# Patient Record
Sex: Female | Born: 1970 | Race: White | Hispanic: No | Marital: Married | Smoking: Never smoker
Health system: Southern US, Community
[De-identification: ages and names within clinical notes are randomized; demographics above are authoritative.]

## PROBLEM LIST (undated history)

## (undated) DIAGNOSIS — G4733 Obstructive sleep apnea (adult) (pediatric): Secondary | ICD-10-CM

## (undated) DIAGNOSIS — E785 Hyperlipidemia, unspecified: Secondary | ICD-10-CM

## (undated) DIAGNOSIS — J309 Allergic rhinitis, unspecified: Secondary | ICD-10-CM

## (undated) DIAGNOSIS — I10 Essential (primary) hypertension: Secondary | ICD-10-CM

## (undated) DIAGNOSIS — G4730 Sleep apnea, unspecified: Secondary | ICD-10-CM

## (undated) HISTORY — DX: Essential (primary) hypertension: I10

## (undated) HISTORY — DX: Hyperlipidemia, unspecified: E78.5

## (undated) HISTORY — PX: OTHER SURGICAL HISTORY: SHX169

## (undated) HISTORY — DX: Gilbert syndrome: E80.4

## (undated) HISTORY — DX: Obstructive sleep apnea (adult) (pediatric): G47.33

## (undated) HISTORY — DX: Sleep apnea, unspecified: G47.30

## (undated) HISTORY — DX: Allergic rhinitis, unspecified: J30.9

---

## 1998-06-25 ENCOUNTER — Emergency Department (HOSPITAL_COMMUNITY): Admission: EM | Admit: 1998-06-25 | Discharge: 1998-06-26 | Payer: Self-pay | Admitting: Emergency Medicine

## 1999-03-29 ENCOUNTER — Other Ambulatory Visit: Admission: RE | Admit: 1999-03-29 | Discharge: 1999-03-29 | Payer: Self-pay | Admitting: Obstetrics and Gynecology

## 1999-10-15 ENCOUNTER — Inpatient Hospital Stay (HOSPITAL_COMMUNITY): Admission: AD | Admit: 1999-10-15 | Discharge: 1999-10-18 | Payer: Self-pay | Admitting: Obstetrics and Gynecology

## 1999-11-16 ENCOUNTER — Other Ambulatory Visit: Admission: RE | Admit: 1999-11-16 | Discharge: 1999-11-16 | Payer: Self-pay | Admitting: Obstetrics and Gynecology

## 2000-12-17 ENCOUNTER — Other Ambulatory Visit: Admission: RE | Admit: 2000-12-17 | Discharge: 2000-12-17 | Payer: Self-pay | Admitting: Obstetrics and Gynecology

## 2001-11-13 ENCOUNTER — Emergency Department (HOSPITAL_COMMUNITY): Admission: EM | Admit: 2001-11-13 | Discharge: 2001-11-13 | Payer: Self-pay | Admitting: Emergency Medicine

## 2002-02-02 ENCOUNTER — Other Ambulatory Visit: Admission: RE | Admit: 2002-02-02 | Discharge: 2002-02-02 | Payer: Self-pay | Admitting: Obstetrics and Gynecology

## 2003-01-11 ENCOUNTER — Other Ambulatory Visit: Admission: RE | Admit: 2003-01-11 | Discharge: 2003-01-11 | Payer: Self-pay | Admitting: Obstetrics and Gynecology

## 2003-07-15 ENCOUNTER — Inpatient Hospital Stay (HOSPITAL_COMMUNITY): Admission: AD | Admit: 2003-07-15 | Discharge: 2003-07-16 | Payer: Self-pay | Admitting: Obstetrics and Gynecology

## 2003-07-17 ENCOUNTER — Inpatient Hospital Stay (HOSPITAL_COMMUNITY): Admission: AD | Admit: 2003-07-17 | Discharge: 2003-07-17 | Payer: Self-pay | Admitting: Obstetrics and Gynecology

## 2003-07-20 ENCOUNTER — Inpatient Hospital Stay (HOSPITAL_COMMUNITY): Admission: AD | Admit: 2003-07-20 | Discharge: 2003-07-23 | Payer: Self-pay | Admitting: Obstetrics and Gynecology

## 2003-09-05 ENCOUNTER — Other Ambulatory Visit: Admission: RE | Admit: 2003-09-05 | Discharge: 2003-09-05 | Payer: Self-pay | Admitting: Obstetrics and Gynecology

## 2005-02-07 ENCOUNTER — Other Ambulatory Visit: Admission: RE | Admit: 2005-02-07 | Discharge: 2005-02-07 | Payer: Self-pay | Admitting: Obstetrics and Gynecology

## 2016-01-12 ENCOUNTER — Other Ambulatory Visit: Payer: Self-pay | Admitting: Obstetrics and Gynecology

## 2016-01-16 ENCOUNTER — Other Ambulatory Visit: Payer: Self-pay | Admitting: Obstetrics and Gynecology

## 2016-01-16 DIAGNOSIS — N6320 Unspecified lump in the left breast, unspecified quadrant: Secondary | ICD-10-CM

## 2016-01-18 ENCOUNTER — Ambulatory Visit
Admission: RE | Admit: 2016-01-18 | Discharge: 2016-01-18 | Disposition: A | Discharge status: Home / Self Care | Payer: BLUE CROSS/BLUE SHIELD | Source: Ambulatory Visit | Attending: Obstetrics and Gynecology | Admitting: Obstetrics and Gynecology

## 2016-01-18 ENCOUNTER — Other Ambulatory Visit: Payer: Self-pay | Admitting: Obstetrics and Gynecology

## 2016-01-18 DIAGNOSIS — N6320 Unspecified lump in the left breast, unspecified quadrant: Secondary | ICD-10-CM

## 2016-03-11 DIAGNOSIS — M5413 Radiculopathy, cervicothoracic region: Secondary | ICD-10-CM | POA: Diagnosis not present

## 2016-03-11 DIAGNOSIS — M542 Cervicalgia: Secondary | ICD-10-CM | POA: Diagnosis not present

## 2016-03-13 DIAGNOSIS — M542 Cervicalgia: Secondary | ICD-10-CM | POA: Diagnosis not present

## 2016-03-13 DIAGNOSIS — M5413 Radiculopathy, cervicothoracic region: Secondary | ICD-10-CM | POA: Diagnosis not present

## 2016-03-18 DIAGNOSIS — M5413 Radiculopathy, cervicothoracic region: Secondary | ICD-10-CM | POA: Diagnosis not present

## 2016-03-18 DIAGNOSIS — M542 Cervicalgia: Secondary | ICD-10-CM | POA: Diagnosis not present

## 2016-03-20 DIAGNOSIS — M542 Cervicalgia: Secondary | ICD-10-CM | POA: Diagnosis not present

## 2016-03-20 DIAGNOSIS — M5413 Radiculopathy, cervicothoracic region: Secondary | ICD-10-CM | POA: Diagnosis not present

## 2016-03-28 DIAGNOSIS — M542 Cervicalgia: Secondary | ICD-10-CM | POA: Diagnosis not present

## 2016-03-28 DIAGNOSIS — M5413 Radiculopathy, cervicothoracic region: Secondary | ICD-10-CM | POA: Diagnosis not present

## 2016-04-01 DIAGNOSIS — M5413 Radiculopathy, cervicothoracic region: Secondary | ICD-10-CM | POA: Diagnosis not present

## 2016-04-01 DIAGNOSIS — M542 Cervicalgia: Secondary | ICD-10-CM | POA: Diagnosis not present

## 2016-10-21 DIAGNOSIS — Z23 Encounter for immunization: Secondary | ICD-10-CM | POA: Diagnosis not present

## 2016-11-28 DIAGNOSIS — M7712 Lateral epicondylitis, left elbow: Secondary | ICD-10-CM | POA: Diagnosis not present

## 2016-12-06 DIAGNOSIS — M545 Low back pain: Secondary | ICD-10-CM | POA: Diagnosis not present

## 2016-12-06 DIAGNOSIS — M542 Cervicalgia: Secondary | ICD-10-CM | POA: Diagnosis not present

## 2017-01-28 DIAGNOSIS — Z6825 Body mass index (BMI) 25.0-25.9, adult: Secondary | ICD-10-CM | POA: Diagnosis not present

## 2017-01-28 DIAGNOSIS — Z1212 Encounter for screening for malignant neoplasm of rectum: Secondary | ICD-10-CM | POA: Diagnosis not present

## 2017-01-28 DIAGNOSIS — Z1231 Encounter for screening mammogram for malignant neoplasm of breast: Secondary | ICD-10-CM | POA: Diagnosis not present

## 2017-01-28 DIAGNOSIS — Z01419 Encounter for gynecological examination (general) (routine) without abnormal findings: Secondary | ICD-10-CM | POA: Diagnosis not present

## 2017-02-20 DIAGNOSIS — I781 Nevus, non-neoplastic: Secondary | ICD-10-CM | POA: Diagnosis not present

## 2017-02-20 DIAGNOSIS — Z808 Family history of malignant neoplasm of other organs or systems: Secondary | ICD-10-CM | POA: Diagnosis not present

## 2017-04-10 DIAGNOSIS — S76112A Strain of left quadriceps muscle, fascia and tendon, initial encounter: Secondary | ICD-10-CM | POA: Diagnosis not present

## 2017-04-15 DIAGNOSIS — S76112A Strain of left quadriceps muscle, fascia and tendon, initial encounter: Secondary | ICD-10-CM | POA: Diagnosis not present

## 2017-04-21 DIAGNOSIS — S76112A Strain of left quadriceps muscle, fascia and tendon, initial encounter: Secondary | ICD-10-CM | POA: Diagnosis not present

## 2017-04-23 DIAGNOSIS — H52223 Regular astigmatism, bilateral: Secondary | ICD-10-CM | POA: Diagnosis not present

## 2017-04-24 DIAGNOSIS — S76112A Strain of left quadriceps muscle, fascia and tendon, initial encounter: Secondary | ICD-10-CM | POA: Diagnosis not present

## 2017-10-02 DIAGNOSIS — S76112A Strain of left quadriceps muscle, fascia and tendon, initial encounter: Secondary | ICD-10-CM | POA: Diagnosis not present

## 2017-10-08 DIAGNOSIS — S76112A Strain of left quadriceps muscle, fascia and tendon, initial encounter: Secondary | ICD-10-CM | POA: Diagnosis not present

## 2017-10-27 DIAGNOSIS — M25511 Pain in right shoulder: Secondary | ICD-10-CM | POA: Diagnosis not present

## 2017-10-27 DIAGNOSIS — S76112A Strain of left quadriceps muscle, fascia and tendon, initial encounter: Secondary | ICD-10-CM | POA: Diagnosis not present

## 2017-11-07 DIAGNOSIS — Z0000 Encounter for general adult medical examination without abnormal findings: Secondary | ICD-10-CM | POA: Diagnosis not present

## 2017-11-13 DIAGNOSIS — Z1389 Encounter for screening for other disorder: Secondary | ICD-10-CM | POA: Diagnosis not present

## 2017-11-13 DIAGNOSIS — R7301 Impaired fasting glucose: Secondary | ICD-10-CM | POA: Diagnosis not present

## 2017-11-13 DIAGNOSIS — R1310 Dysphagia, unspecified: Secondary | ICD-10-CM | POA: Diagnosis not present

## 2017-11-13 DIAGNOSIS — J309 Allergic rhinitis, unspecified: Secondary | ICD-10-CM | POA: Diagnosis not present

## 2017-11-13 DIAGNOSIS — Z0000 Encounter for general adult medical examination without abnormal findings: Secondary | ICD-10-CM | POA: Diagnosis not present

## 2017-11-21 DIAGNOSIS — Z1212 Encounter for screening for malignant neoplasm of rectum: Secondary | ICD-10-CM | POA: Diagnosis not present

## 2018-01-29 DIAGNOSIS — Z6826 Body mass index (BMI) 26.0-26.9, adult: Secondary | ICD-10-CM | POA: Diagnosis not present

## 2018-01-29 DIAGNOSIS — Z01419 Encounter for gynecological examination (general) (routine) without abnormal findings: Secondary | ICD-10-CM | POA: Diagnosis not present

## 2018-01-29 DIAGNOSIS — Z1231 Encounter for screening mammogram for malignant neoplasm of breast: Secondary | ICD-10-CM | POA: Diagnosis not present

## 2018-02-02 DIAGNOSIS — S46011A Strain of muscle(s) and tendon(s) of the rotator cuff of right shoulder, initial encounter: Secondary | ICD-10-CM | POA: Diagnosis not present

## 2018-02-09 DIAGNOSIS — S46011A Strain of muscle(s) and tendon(s) of the rotator cuff of right shoulder, initial encounter: Secondary | ICD-10-CM | POA: Diagnosis not present

## 2018-02-17 DIAGNOSIS — B0239 Other herpes zoster eye disease: Secondary | ICD-10-CM | POA: Diagnosis not present

## 2018-02-17 DIAGNOSIS — B029 Zoster without complications: Secondary | ICD-10-CM | POA: Diagnosis not present

## 2018-02-20 DIAGNOSIS — B0239 Other herpes zoster eye disease: Secondary | ICD-10-CM | POA: Diagnosis not present

## 2018-02-23 DIAGNOSIS — B0230 Zoster ocular disease, unspecified: Secondary | ICD-10-CM | POA: Diagnosis not present

## 2018-03-02 DIAGNOSIS — S46011A Strain of muscle(s) and tendon(s) of the rotator cuff of right shoulder, initial encounter: Secondary | ICD-10-CM | POA: Diagnosis not present

## 2018-03-09 DIAGNOSIS — S46011A Strain of muscle(s) and tendon(s) of the rotator cuff of right shoulder, initial encounter: Secondary | ICD-10-CM | POA: Diagnosis not present

## 2018-04-23 DIAGNOSIS — M25562 Pain in left knee: Secondary | ICD-10-CM | POA: Diagnosis not present

## 2018-05-05 DIAGNOSIS — M25562 Pain in left knee: Secondary | ICD-10-CM | POA: Diagnosis not present

## 2018-09-18 DIAGNOSIS — Z23 Encounter for immunization: Secondary | ICD-10-CM | POA: Diagnosis not present

## 2019-02-18 DIAGNOSIS — M25512 Pain in left shoulder: Secondary | ICD-10-CM | POA: Diagnosis not present

## 2019-04-22 DIAGNOSIS — M542 Cervicalgia: Secondary | ICD-10-CM | POA: Diagnosis not present

## 2019-11-03 DIAGNOSIS — Z1231 Encounter for screening mammogram for malignant neoplasm of breast: Secondary | ICD-10-CM | POA: Diagnosis not present

## 2019-11-10 DIAGNOSIS — R7301 Impaired fasting glucose: Secondary | ICD-10-CM | POA: Diagnosis not present

## 2019-11-10 DIAGNOSIS — E7849 Other hyperlipidemia: Secondary | ICD-10-CM | POA: Diagnosis not present

## 2019-11-17 DIAGNOSIS — R030 Elevated blood-pressure reading, without diagnosis of hypertension: Secondary | ICD-10-CM | POA: Diagnosis not present

## 2019-11-17 DIAGNOSIS — E785 Hyperlipidemia, unspecified: Secondary | ICD-10-CM | POA: Diagnosis not present

## 2019-11-17 DIAGNOSIS — R1310 Dysphagia, unspecified: Secondary | ICD-10-CM | POA: Diagnosis not present

## 2019-11-17 DIAGNOSIS — E7849 Other hyperlipidemia: Secondary | ICD-10-CM | POA: Diagnosis not present

## 2019-11-17 DIAGNOSIS — Z1331 Encounter for screening for depression: Secondary | ICD-10-CM | POA: Diagnosis not present

## 2019-11-17 DIAGNOSIS — Z0000 Encounter for general adult medical examination without abnormal findings: Secondary | ICD-10-CM | POA: Diagnosis not present

## 2019-11-17 DIAGNOSIS — R7301 Impaired fasting glucose: Secondary | ICD-10-CM | POA: Diagnosis not present

## 2019-11-18 DIAGNOSIS — Z6827 Body mass index (BMI) 27.0-27.9, adult: Secondary | ICD-10-CM | POA: Diagnosis not present

## 2019-11-18 DIAGNOSIS — Z01419 Encounter for gynecological examination (general) (routine) without abnormal findings: Secondary | ICD-10-CM | POA: Diagnosis not present

## 2019-11-24 ENCOUNTER — Institutional Professional Consult (permissible substitution): Payer: Self-pay | Admitting: Neurology

## 2019-12-07 ENCOUNTER — Other Ambulatory Visit: Payer: Self-pay

## 2019-12-07 ENCOUNTER — Ambulatory Visit (INDEPENDENT_AMBULATORY_CARE_PROVIDER_SITE_OTHER): Payer: BC Managed Care – PPO | Admitting: Neurology

## 2019-12-07 ENCOUNTER — Encounter: Payer: Self-pay | Admitting: Neurology

## 2019-12-07 VITALS — BP 162/98 | HR 96 | Temp 97.9°F | Ht 63.0 in | Wt 156.0 lb

## 2019-12-07 DIAGNOSIS — D582 Other hemoglobinopathies: Secondary | ICD-10-CM

## 2019-12-07 DIAGNOSIS — R0683 Snoring: Secondary | ICD-10-CM | POA: Diagnosis not present

## 2019-12-07 DIAGNOSIS — I10 Essential (primary) hypertension: Secondary | ICD-10-CM | POA: Insufficient documentation

## 2019-12-07 DIAGNOSIS — K219 Gastro-esophageal reflux disease without esophagitis: Secondary | ICD-10-CM | POA: Diagnosis not present

## 2019-12-07 DIAGNOSIS — R0681 Apnea, not elsewhere classified: Secondary | ICD-10-CM

## 2019-12-07 NOTE — Progress Notes (Signed)
SLEEP MEDICINE CLINIC    Provider:  Larey Seat, MD  Primary Care Physician:  Marton Redwood, MD [place] [address]     Referring Provider: Marton Redwood, [place],  [place] [address]          Chief Complaint according to patient   Patient presents with:    . New Patient (Initial Visit)     pt alone, rm 10. pt states that following with PCP her BP is higher which is new and her HGB levels were elevated. she states her dad as OSA and he had same concern and PCP wanted to work this up. Averages about 7 hrs of sleep and states been told she snores. , no prior PSG.      HISTORY OF PRESENT ILLNESS:  DELIJAH CASHMAN is a 48 y.o. year old Caucasian female patient seen here upon referral on 12/07/2019 from  Dr. Brigitte Pulse.  Chief concern according to patient :  Mrs. Gurganus has a more recent history of high blood pressures and elevated hemoglobin.  Interestingly her father suffered from what was briefly considered erythema vera and instead of being seen by hematology he was first referred for a sleep study here to [place] [facility]. him lipid levels have normalized.  We hope that he can do the same for his daughter Beverlee Nims Buntin, who also has an interesting history of a port wine stain affecting the right neck, mother and son of the patient also have abnormal pigmentation.  No history of Sturge-Weber syndrome, no history of seizures, no mental retardation.   I have the pleasure of seeing Rafia R Leiva today, a right-handed White or Caucasian female with a possible sleep disorder.  She has acid reflux, dysphagia, and is snoring.    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  Family status is married , with 3 children.  The patient currently works. Pets are present. One dog.  Tobacco use- never .  ETOH use -socially, Caffeine intake in form of Coffee( none ) Soda( 1 a day) Tea ( 3-4 week) ,  and no energy drinks. Regular exercise in form of Tennis. Walking .     Sleep habits are as follows: The patient's dinner time is between 6-9 PM.  Depending on son's sport practice hours. The patient goes to bed at 9.30-10  PM and  Falls asleep on the couch before. Once in the bedroom, which is dark, cool, and quiet, she continues to sleep for 5-7 hours, wakes for one  bathroom break,.The preferred sleep position is supine, with the support of 2 pillows.  Dreams are reportedly  frequent/vivid. Her s children stated she snores loudly.  5.45  AM is the usual rise time. The patient wakes up spontaneously at 5.30 without an alarm.  She reports still  feeling refreshed or restored in AM, rarely reporting  symptoms such as dry mouth, no morning headaches,  and no residual fatigue.  Naps are taken infrequently, lasting 30 minutes.    Review of Systems: Out of a complete 14 system review, the patient complains of only the following symptoms, and all other reviewed systems are negative.:  Fatigue, sleepiness , snoring, fragmented sleep.   How likely are you to doze in the following situations: 0 = not likely, 1 = slight chance, 2 = moderate chance, 3 = high chance   Sitting and Reading? 3 Watching Television?  2 Sitting inactive in a public place (theater or meeting)?2 As a passenger in a car for an hour without a break? 3 Lying down in the afternoon when circumstances permit?2 Sitting and talking to someone?0 Sitting quietly after lunch without alcohol?3 In a car, while stopped for a few minutes in traffic?0  Total = 13 / 24 points   FSS endorsed at 17/ 63 points.   Social History   Socioeconomic History  . Marital status: Married    Spouse name: Not on file  . Number of children: Not on file  . Years of education: Not on file  . Highest education level: Not on file  Occupational History  . Not on file  Tobacco Use  . Smoking status: Never Smoker  . Smokeless tobacco: Never Used    Substance and Sexual Activity  . Alcohol use: Yes    Alcohol/week: 7.0 standard drinks    Types: 7 Glasses of wine per week  . Drug use: Not Currently  . Sexual activity: Not on file  Other Topics Concern  . Not on file  Social History Narrative  . Not on file   Social Determinants of Health   Financial Resource Strain:   . Difficulty of Paying Living Expenses: Not on file  Food Insecurity:   . Worried About Charity fundraiser in the Last Year: Not on file  . Ran Out of Food in the Last Year: Not on file  Transportation Needs:   . Lack of Transportation (Medical): Not on file  . Lack of Transportation (Non-Medical): Not on file  Physical Activity:   . Days of Exercise per Week: Not on file  . Minutes of Exercise per Session: Not on file  Stress:   . Feeling of Stress : Not on file  Social Connections:   . Frequency of Communication with Friends and Family: Not on file  . Frequency of Social Gatherings with Friends and Family: Not on file  . Attends Religious Services: Not on file  . Active Member of Clubs or Organizations: Not on file  . Attends Archivist Meetings: Not on file  . Marital Status: Not on file    Family History  Problem Relation Age of Onset  . Hypertension Mother   . Hyperlipidemia Mother   . Hypertension Father   . Hyperlipidemia Father   . Colon cancer Maternal Grandfather     Past Medical History:  Diagnosis Date  . HLD (hyperlipidemia)   . Hypertension        Current Outpatient Medications on File Prior to Visit  Medication Sig Dispense Refill  . cetirizine (ZYRTEC) 10 MG tablet Take 10 mg by mouth daily.    Marland Kitchen ibuprofen (ADVIL) 200 MG tablet Take 200 mg by mouth every 8 (eight) hours as needed.     No current facility-administered medications on file prior to visit.    Allergies  Allergen Reactions  . Sulfur Rash    Physical exam:  Today's Vitals   12/07/19 1517  BP: (!) 162/98  Pulse: 96  Temp: 97.9 F (36.6 C)   Weight: 156 lb (70.8 kg)  Height: 5\' 3"  (1.6 m)   Body mass index is 27.63 kg/m.   Wt Readings from Last 3 Encounters:  12/07/19 156 lb (70.8 kg)     Ht Readings from Last 3 Encounters:  12/07/19 5\' 3"  (1.6 m)      General: The patient is awake, alert and appears not in acute distress. The patient  is well groomed. Head: Normocephalic, atraumatic. Neck is supple. Mallampati  3- thick and elongated uvula.  neck circumference:*14 inches .  Nasal airflow patent.  Retrognathia is not seen.  Barretts oesphagiua.  Dental status:  Cardiovascular:  Regular rate and cardiac rhythm by pulse,  without distended neck veins. Respiratory: Lungs are clear to auscultation.  Skin:  Without evidence of ankle edema, or rash. Trunk: The patient's posture is erect.   Neurologic exam : The patient is awake and alert, oriented to place and time.   Memory subjective described as intact.  Attention span & concentration ability appears normal.  Speech is fluent,  without  dysarthria, dysphonia or aphasia.  Mood and affect are appropriate.   Cranial nerves: no loss of smell or taste reported  Pupils are equal and briskly reactive to light. Funduscopic exam deferred. .  Extraocular movements in vertical and horizontal planes were intact and without nystagmus. No Diplopia. Visual fields by finger perimetry are intact. Hearing was intact to soft voice and finger rubbing.   Facial sensation intact to fine touch. Facial motor strength is symmetric and tongue and uvula move midline.  Neck ROM : rotation, tilt and flexion extension were normal for age and shoulder shrug was symmetrical.    Motor exam:  Symmetric bulk, tone and ROM.   Normal tone without cog wheeling, symmetric grip strength .   Sensory:  Fine touch, pinprick and vibration were tested  and  normal.  Proprioception tested in the upper extremities was normal.   Coordination: Rapid alternating movements in the fingers/hands were of normal  speed.  The Finger-to-nose maneuver was intact without evidence of ataxia, dysmetria or tremor.   Gait and station: Patient could rise unassisted from a seated position, walked without assistive device.  Stance is of normal width/ base and the patient turned with 3 steps ( RN observation)..  Toe and heel walk were deferred.  Deep tendon reflexes: in the  upper and lower extremities are symmetric and brisk- Babinski response was deferred.        After spending a total time of  40  minutes face to face and additional time for physical and neurologic examination, review of laboratory studies,  personal review of imaging studies, reports and results of other testing and review of referral information / records as far as provided in visit, I have established the following assessments:  1) narrow upper airway and GERD may correlate to snoring and apnea.  2) HTN , here 162/ 98 mmHg.  3)  elevated hemoglobin    My Plan is to proceed with:  1)  OSA screening , which can be done by HST or attended sleep study. Since we can correlate the Barrett's and hemoglobin elevation to her father's health history , it is worth looking at OSA as a cause.      I would like to thank Marton Redwood, MD , [address] [place],  [place] [address] for allowing me to meet with and to take care of this pleasant patient.   In short, Angela Carter is presenting with high hemoglobin levels , HTN and snoring -  I plan to follow up either personally or through our NP within 2-3 month.   CC: I will share my notes with PCP .  Electronically signed by: Larey Seat, MD 12/07/2019 3:38 PM  [facility] and [facility] Sleep Board certified by The [employer] of Sleep Medicine and Diplomate of the [employer] of Sleep Medicine. Board certified  In Neurology through the ABPN, Fellow of the [employer] of Neurology. Medical Director of [employer].

## 2019-12-07 NOTE — Patient Instructions (Signed)

## 2019-12-29 ENCOUNTER — Ambulatory Visit (INDEPENDENT_AMBULATORY_CARE_PROVIDER_SITE_OTHER): Payer: BC Managed Care – PPO | Admitting: Neurology

## 2019-12-29 DIAGNOSIS — K219 Gastro-esophageal reflux disease without esophagitis: Secondary | ICD-10-CM

## 2019-12-29 DIAGNOSIS — R0681 Apnea, not elsewhere classified: Secondary | ICD-10-CM

## 2019-12-29 DIAGNOSIS — G4733 Obstructive sleep apnea (adult) (pediatric): Secondary | ICD-10-CM

## 2019-12-29 DIAGNOSIS — I10 Essential (primary) hypertension: Secondary | ICD-10-CM

## 2019-12-29 DIAGNOSIS — R0683 Snoring: Secondary | ICD-10-CM

## 2019-12-29 DIAGNOSIS — D582 Other hemoglobinopathies: Secondary | ICD-10-CM

## 2020-01-05 DIAGNOSIS — G4733 Obstructive sleep apnea (adult) (pediatric): Secondary | ICD-10-CM | POA: Insufficient documentation

## 2020-01-05 NOTE — Progress Notes (Signed)
There is moderate- severe Sleep Apnea of Obstructive type  documented, with an AHI of 25.1/h and REM AHI of 32.7/h. Normal  heart rate variability was seen and no prolonged hypoxia is  associated. The patient also snores moderately loud. In an  unusual finding, the patient had higher apnea indices when prone  or in lateral sleep position  The sleep architecture suggests early REM sleep onset and high  REM sleep proportion.   Recommendations:    The distribution by position is very unusual, but the REM sleep  AHI versus general AHI indicates a REM accentuated apnea form,  typical of OSA. This responds best to therapy in form of positive  airway pressure (PAP) and my order will be for: autotitration  CPAP device with a setting from 5-15 cm water, 2 cm EPR and mask  of patient's choice, and heated humidification.   Interpreting Physician: Larey Seat, MD

## 2020-01-05 NOTE — Addendum Note (Signed)
Addended by: Larey Seat on: 01/05/2020 06:10 PM   Modules accepted: Orders

## 2020-01-05 NOTE — Procedures (Signed)
Patient Information     First Name: Angela Last Name: Carter Carrazana: [id number]  Birth Date: [birth year] Age: 49 Gender: Female  Referring Provider: Link Snuffer, MD BMI: 27.7 (W=156 lb, H=5' 3'')  Neck Circ.:  14 '' Epworth:  13/24   Sleep Study Information    Study Date: Dec 29, 2019 S/H/A Version: 001.001.001.001 / 4.1.1528 / 53  History:    R Bittman is a right-handed White or Caucasian female seen on 12-07-2019.  She has acid reflux, dysphagia, and is snoring.  [name] has a more recent history of high blood pressures and elevated hemoglobin.  Interestingly her father suffered from what was briefly considered polyerythema vera and was first referred for a sleep study here to [place] [facility], his levels have since normalized.   We hope that he can do the same for his daughter Angela Carter, who also has an interesting history of a port wine stain affecting the right neck, her mother and son also have abnormal pigmentation.  No history of Sturge-Weber syndrome, no history of seizures, no mental retardation.       Summary & Diagnosis:    There is moderate- severe Sleep Apnea of Obstructive type documented, with an AHI of 25.1/h and REM AHI of 32.7/h. Normal heart rate variability was seen and no prolonged hypoxia is associated. The patient also snores moderately loud. In an unusual finding, the patient had higher apnea indices when prone or in lateral sleep position  The sleep architecture suggests early REM sleep onset and high REM sleep proportion.   Recommendations:      The distribution by position is very unusual, but the REM sleep AHI versus general AHI indicates a REM accentuated apnea form, typical of OSA. This responds best to therapy in form of positive airway pressure (PAP) and my order will be for: autotitration CPAP device with a setting from 5-15 cm water, 2 cm EPR and mask of patient's choice, and heated humidification.   Interpreting Physician: Larey Seat, MD             Sleep Summary  Oxygen Saturation Statistics   Start Study Time: End Study Time: Total Recording Time:  9:58:09 PM 6:00:48 AM 8 h, 2 min  Total Sleep Time % REM of Sleep Time:  7 h, 24 min  36.2    Mean: 94 Minimum: 91 Maximum: 98  Mean of Desaturations Nadirs (%):   93  Oxygen Desaturation. %: 4-9 10-20 >20 Total  Events Number Total  18 100.0  0 0.0  0 0.0  18 100.0  Oxygen Saturation: <90 <=88 <85 <80 <70  Duration (minutes): Sleep % 0.0 0.0 0.0 0.0 0.0 0.0 0.0 0.0 0.0 0.0     Respiratory Indices      Total Events REM NREM All Night  pRDI:  153  pAHI:  132 ODI:  18  pAHIc:  0  % CSR: 0.0 37.1 32.7 10.6 0.0 26.9 23.0 1.5 0.0 29.1 25.1 3.4 0.0       Pulse Rate Statistics during Sleep (BPM)      Mean: 74 Minimum: 57 Maximum: 109    Indices are calculated using technically valid sleep time of 6 h, 15 min. Central-Indices are calculated using technically valid sleep time of 6 h, 6 min. pRDI /pAHI are calculated using 02 desaturations ? 3%  Body Position Statistics  Position Supine Prone Right Left Non-Supine  Sleep (min) 284.0 46.5 113.6 0.0 160.1  Sleep % 63.9 10.5 25.6 0.0 36.1  pRDI 17.9 62.9 57.1 N/A 58.7  pAHI 12.3 62.9 57.1 N/A 58.7  ODI 2.6 7.6 4.8 N/A 5.5     Snoring Statistics Snoring Level (dB) >40 >50 >60 >70 >80 >Threshold (45)  Sleep (min) 155.3 2.8 0.7 0.2 0.0 6.5  Sleep % 35.0 0.6 0.2 0.0 0.0 1.5    Mean: 41 dB

## 2020-01-06 ENCOUNTER — Telehealth: Payer: Self-pay | Admitting: Neurology

## 2020-01-06 NOTE — Telephone Encounter (Signed)
I called pt. I advised pt that Dr. Brett Fairy reviewed their sleep study results and found that pt has moderate to severe sleep apnea . Dr. Brett Fairy recommends that pt starts auto CPAP. I reviewed PAP compliance expectations with the pt. Pt is agreeable to starting a CPAP. I advised pt that an order will be sent to a DME, [facility], and [facility] will call the pt within about one week after they file with the pt's insurance. [facility] will show the pt how to use the machine, fit for masks, and troubleshoot the CPAP if needed. A follow up appt was made for insurance purposes with Ward Givens, NP on March 31,2021 at 11:30 am. Pt verbalized understanding to arrive 15 minutes early and bring their CPAP. A letter with all of this information in it will be mailed to the pt as a reminder. I verified with the pt that the address we have on file is correct. Pt verbalized understanding of results. Pt had no questions at this time but was encouraged to call back if questions arise. I have sent the order to [facility] and have received confirmation that they have received the order.

## 2020-01-06 NOTE — Telephone Encounter (Signed)
-----   Message from Larey Seat, MD sent at 01/05/2020  6:10 PM EST ----- There is moderate- severe Sleep Apnea of Obstructive type  documented, with an AHI of 25.1/h and REM AHI of 32.7/h. Normal  heart rate variability was seen and no prolonged hypoxia is  associated. The patient also snores moderately loud. In an  unusual finding, the patient had higher apnea indices when prone  or in lateral sleep position  The sleep architecture suggests early REM sleep onset and high  REM sleep proportion.   Recommendations:    The distribution by position is very unusual, but the REM sleep  AHI versus general AHI indicates a REM accentuated apnea form,  typical of OSA. This responds best to therapy in form of positive  airway pressure (PAP) and my order will be for: autotitration  CPAP device with a setting from 5-15 cm water, 2 cm EPR and mask  of patient's choice, and heated humidification.   Interpreting Physician: Larey Seat, MD

## 2020-01-24 DIAGNOSIS — G4733 Obstructive sleep apnea (adult) (pediatric): Secondary | ICD-10-CM | POA: Diagnosis not present

## 2020-02-25 ENCOUNTER — Ambulatory Visit: Payer: BLUE CROSS/BLUE SHIELD

## 2020-03-08 ENCOUNTER — Ambulatory Visit: Payer: BC Managed Care – PPO | Admitting: Adult Health

## 2020-03-08 ENCOUNTER — Encounter: Payer: Self-pay | Admitting: Adult Health

## 2020-03-08 ENCOUNTER — Other Ambulatory Visit: Payer: Self-pay

## 2020-03-08 VITALS — BP 147/91 | HR 88 | Temp 98.1°F | Ht 63.0 in | Wt 156.0 lb

## 2020-03-08 DIAGNOSIS — Z9989 Dependence on other enabling machines and devices: Secondary | ICD-10-CM | POA: Diagnosis not present

## 2020-03-08 DIAGNOSIS — G4733 Obstructive sleep apnea (adult) (pediatric): Secondary | ICD-10-CM

## 2020-03-08 NOTE — Progress Notes (Signed)
PATIENT: Angela Carter DOB: 1971-06-17  REASON FOR VISIT: follow up HISTORY FROM: patient  HISTORY OF PRESENT ILLNESS: Today 03/08/20:  Angela Carter is a 49 year old female with a history of obstructive sleep apnea on CPAP.  Her download indicates that she use her machine nightly for compliance of 100%.  She use her machine greater than 4 hours each night.  On average she uses her machine 6 hours and 15 minutes.  Residual AHI is 2.1 on 5 to 15 cm of water with EPR 3.  Leak in the 95th percentile is 3.3 L/min.  Reports that she is tolerating CPAP well.  Reports that she has not been taking her blood pressure regularly so she is unsure how much the CPAP has benefited her blood pressure.  HISTORY Angela Carter a 49 y.o. year old Caucasian female patientseen here upon referralon 12/07/2019 from  Dr. Brigitte Pulse.  Chiefconcernaccording to patient :  Angela Carter has a more recent history of high blood pressures and elevated hemoglobin.  Interestingly her father suffered from what was briefly considered erythema vera and instead of being seen by hematology he was first referred for a sleep study here to [place] [facility]. him lipid levels have normalized.  We hope that he can do the same for his daughter Angela Carter, who also has an interesting history of a port wine stain affecting the right neck, mother and son of the patient also have abnormal pigmentation.  No history of Sturge-Weber syndrome, no history of seizures, no mental retardation.   I have the pleasure of seeing Angela Carter today,a right-handed White or Caucasian female with a possible sleep disorder.  She has acid reflux, dysphagia, and is snoring.   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  Family status is married , with 3 children.  The patient currently works. Pets are present. One dog.  Tobacco use- never . ETOH use -socially, Caffeine intake in form of  Coffee( none ) Soda( 1 a day) Tea ( 3-4 week) , and no energy drinks. Regular exercise in form of Tennis. Walking .     Sleep habits are as follows:The patient's dinner time is between 6-9 PM.  Depending on son's sport practice hours. The patient goes to bed at 9.30-10  PM and  Falls asleep on the couch before. Once in the bedroom, which is dark, cool, and quiet, she continues to sleep for 5-7 hours, wakes for one  bathroom break,.The preferred sleep position is supine, with the support of 2 pillows.  Dreams are reportedly  frequent/vivid. Her s children stated she snores loudly.  5.45  AM is the usual rise time. The patient wakes up spontaneously at 5.30 without an alarm.  She reports still  feeling refreshed or restored in AM, rarely reporting  symptoms such as dry mouth, no morning headaches,  and no residual fatigue.  Naps are taken infrequently, lasting 30 minutes.    REVIEW OF SYSTEMS: Out of a complete 14 system review of symptoms, the patient complains only of the following symptoms, and all other reviewed systems are negative.  See HPI  ALLERGIES: Allergies  Allergen Reactions  . Sulfur Rash    HOME MEDICATIONS: Outpatient Medications Prior to Visit  Medication Sig Dispense Refill  . cetirizine (ZYRTEC) 10 MG tablet Take 10 mg by mouth daily.    Marland Kitchen ibuprofen (ADVIL) 200 MG tablet Take 200 mg by mouth every 8 (eight)  hours as needed.     No facility-administered medications prior to visit.    PAST MEDICAL HISTORY: Past Medical History:  Diagnosis Date  . HLD (hyperlipidemia)   . Hypertension     PAST SURGICAL HISTORY:   FAMILY HISTORY: Family History  Problem Relation Age of Onset  . Hypertension Mother   . Hyperlipidemia Mother   . Hypertension Father   . Hyperlipidemia Father   . Colon cancer Maternal Grandfather     SOCIAL HISTORY: Social History   Socioeconomic History  . Marital status: Married    Spouse name: Not on file  . Number of  children: Not on file  . Years of education: Not on file  . Highest education level: Not on file  Occupational History  . Not on file  Tobacco Use  . Smoking status: Never Smoker  . Smokeless tobacco: Never Used  Substance and Sexual Activity  . Alcohol use: Yes    Alcohol/week: 7.0 standard drinks    Types: 7 Glasses of wine per week  . Drug use: Not Currently  . Sexual activity: Not on file  Other Topics Concern  . Not on file  Social History Narrative  . Not on file   Social Determinants of Health   Financial Resource Strain:   . Difficulty of Paying Living Expenses:   Food Insecurity:   . Worried About Charity fundraiser in the Last Year:   . Arboriculturist in the Last Year:   Transportation Needs:   . Film/video editor (Medical):   Marland Kitchen Lack of Transportation (Non-Medical):   Physical Activity:   . Days of Exercise per Week:   . Minutes of Exercise per Session:   Stress:   . Feeling of Stress :   Social Connections:   . Frequency of Communication with Friends and Family:   . Frequency of Social Gatherings with Friends and Family:   . Attends Religious Services:   . Active Member of Clubs or Organizations:   . Attends Archivist Meetings:   Marland Kitchen Marital Status:   Intimate Partner Violence:   . Fear of Current or Ex-Partner:   . Emotionally Abused:   Marland Kitchen Physically Abused:   . Sexually Abused:       PHYSICAL EXAM  Vitals:   03/08/20 1113  BP: (!) 147/91  Pulse: 88  Temp: 98.1 F (36.7 C)  Weight: 156 lb (70.8 kg)  Height: 5\' 3"  (1.6 m)   Body mass index is 27.63 kg/m.  Generalized: Well developed, in no acute distress  Chest: Lungs clear to auscultation bilaterally  Neurological examination  Mentation: Alert oriented to time, place, history taking. Follows all commands speech and language fluent Cranial nerve II-XII: Extraocular movements were full, visual field were full on confrontational test Head turning and shoulder shrug  were  normal and symmetric. Motor: The motor testing reveals 5 over 5 strength of all 4 extremities. Good symmetric motor tone is noted throughout.  Sensory: Sensory testing is intact to soft touch on all 4 extremities. No evidence of extinction is noted.  Gait and station: Gait is normal.    DIAGNOSTIC DATA (LABS, IMAGING, TESTING) - I reviewed patient records, labs, notes, testing and imaging myself where available.     ASSESSMENT AND PLAN 49 y.o. year old female  has a past medical history of HLD (hyperlipidemia) and Hypertension. here with:  1. OSA on CPAP  - CPAP compliance excellent - Good treatment of AHI  -  Encourage patient to use CPAP nightly and > 4 hours each night - F/U in 1 year or sooner if needed   I spent 20 minutes of face-to-face and non-face-to-face time with patient.  This included previsit chart review, lab review, study review, order entry, electronic health record documentation, patient education.  Ward Givens, MSN, NP-C 03/08/2020, 11:36 AM [facility] [address] [place], [place] [address] [phone]

## 2020-03-08 NOTE — Patient Instructions (Signed)
Continue using CPAP nightly and greater than 4 hours each night °If your symptoms worsen or you develop new symptoms please let us know.  ° °

## 2020-03-23 DIAGNOSIS — G4733 Obstructive sleep apnea (adult) (pediatric): Secondary | ICD-10-CM | POA: Diagnosis not present

## 2020-04-22 DIAGNOSIS — G4733 Obstructive sleep apnea (adult) (pediatric): Secondary | ICD-10-CM | POA: Diagnosis not present

## 2020-05-23 DIAGNOSIS — G4733 Obstructive sleep apnea (adult) (pediatric): Secondary | ICD-10-CM | POA: Diagnosis not present

## 2020-05-25 DIAGNOSIS — N393 Stress incontinence (female) (male): Secondary | ICD-10-CM | POA: Diagnosis not present

## 2020-06-22 DIAGNOSIS — G4733 Obstructive sleep apnea (adult) (pediatric): Secondary | ICD-10-CM | POA: Diagnosis not present

## 2020-07-04 DIAGNOSIS — R350 Frequency of micturition: Secondary | ICD-10-CM | POA: Diagnosis not present

## 2020-07-04 DIAGNOSIS — N393 Stress incontinence (female) (male): Secondary | ICD-10-CM | POA: Diagnosis not present

## 2020-07-17 DIAGNOSIS — N393 Stress incontinence (female) (male): Secondary | ICD-10-CM | POA: Diagnosis not present

## 2020-07-28 DIAGNOSIS — N393 Stress incontinence (female) (male): Secondary | ICD-10-CM | POA: Diagnosis not present

## 2020-07-28 DIAGNOSIS — R350 Frequency of micturition: Secondary | ICD-10-CM | POA: Diagnosis not present

## 2020-07-28 DIAGNOSIS — M6281 Muscle weakness (generalized): Secondary | ICD-10-CM | POA: Diagnosis not present

## 2020-07-28 DIAGNOSIS — M6289 Other specified disorders of muscle: Secondary | ICD-10-CM | POA: Diagnosis not present

## 2020-08-01 DIAGNOSIS — N393 Stress incontinence (female) (male): Secondary | ICD-10-CM | POA: Diagnosis not present

## 2020-08-01 DIAGNOSIS — M6289 Other specified disorders of muscle: Secondary | ICD-10-CM | POA: Diagnosis not present

## 2020-08-01 DIAGNOSIS — M6281 Muscle weakness (generalized): Secondary | ICD-10-CM | POA: Diagnosis not present

## 2020-08-01 DIAGNOSIS — R350 Frequency of micturition: Secondary | ICD-10-CM | POA: Diagnosis not present

## 2020-08-15 DIAGNOSIS — N393 Stress incontinence (female) (male): Secondary | ICD-10-CM | POA: Diagnosis not present

## 2020-08-15 DIAGNOSIS — M6281 Muscle weakness (generalized): Secondary | ICD-10-CM | POA: Diagnosis not present

## 2020-08-15 DIAGNOSIS — M6289 Other specified disorders of muscle: Secondary | ICD-10-CM | POA: Diagnosis not present

## 2020-08-15 DIAGNOSIS — M62838 Other muscle spasm: Secondary | ICD-10-CM | POA: Diagnosis not present

## 2020-08-29 DIAGNOSIS — M62838 Other muscle spasm: Secondary | ICD-10-CM | POA: Diagnosis not present

## 2020-08-29 DIAGNOSIS — M6281 Muscle weakness (generalized): Secondary | ICD-10-CM | POA: Diagnosis not present

## 2020-08-29 DIAGNOSIS — N393 Stress incontinence (female) (male): Secondary | ICD-10-CM | POA: Diagnosis not present

## 2020-08-29 DIAGNOSIS — M6289 Other specified disorders of muscle: Secondary | ICD-10-CM | POA: Diagnosis not present

## 2020-09-11 DIAGNOSIS — Z23 Encounter for immunization: Secondary | ICD-10-CM | POA: Diagnosis not present

## 2020-09-11 DIAGNOSIS — I10 Essential (primary) hypertension: Secondary | ICD-10-CM | POA: Diagnosis not present

## 2020-09-22 DIAGNOSIS — G4733 Obstructive sleep apnea (adult) (pediatric): Secondary | ICD-10-CM | POA: Diagnosis not present

## 2020-10-17 DIAGNOSIS — N393 Stress incontinence (female) (male): Secondary | ICD-10-CM | POA: Diagnosis not present

## 2020-10-23 DIAGNOSIS — G4733 Obstructive sleep apnea (adult) (pediatric): Secondary | ICD-10-CM | POA: Diagnosis not present

## 2020-10-25 DIAGNOSIS — G4733 Obstructive sleep apnea (adult) (pediatric): Secondary | ICD-10-CM | POA: Diagnosis not present

## 2020-11-15 DIAGNOSIS — Z0000 Encounter for general adult medical examination without abnormal findings: Secondary | ICD-10-CM | POA: Diagnosis not present

## 2020-11-15 DIAGNOSIS — E785 Hyperlipidemia, unspecified: Secondary | ICD-10-CM | POA: Diagnosis not present

## 2020-11-15 DIAGNOSIS — R7301 Impaired fasting glucose: Secondary | ICD-10-CM | POA: Diagnosis not present

## 2020-11-22 DIAGNOSIS — Z0000 Encounter for general adult medical examination without abnormal findings: Secondary | ICD-10-CM | POA: Diagnosis not present

## 2020-11-22 DIAGNOSIS — Z1339 Encounter for screening examination for other mental health and behavioral disorders: Secondary | ICD-10-CM | POA: Diagnosis not present

## 2020-11-22 DIAGNOSIS — Z1212 Encounter for screening for malignant neoplasm of rectum: Secondary | ICD-10-CM | POA: Diagnosis not present

## 2020-11-22 DIAGNOSIS — R82998 Other abnormal findings in urine: Secondary | ICD-10-CM | POA: Diagnosis not present

## 2020-11-22 DIAGNOSIS — Z1331 Encounter for screening for depression: Secondary | ICD-10-CM | POA: Diagnosis not present

## 2020-11-22 DIAGNOSIS — I10 Essential (primary) hypertension: Secondary | ICD-10-CM | POA: Diagnosis not present

## 2020-11-22 LAB — IFOBT (OCCULT BLOOD): IFOBT: NEGATIVE

## 2020-11-24 DIAGNOSIS — N393 Stress incontinence (female) (male): Secondary | ICD-10-CM | POA: Diagnosis not present

## 2020-11-24 DIAGNOSIS — G4733 Obstructive sleep apnea (adult) (pediatric): Secondary | ICD-10-CM | POA: Diagnosis not present

## 2020-11-29 ENCOUNTER — Encounter: Payer: Self-pay | Admitting: Internal Medicine

## 2020-12-07 DIAGNOSIS — N393 Stress incontinence (female) (male): Secondary | ICD-10-CM | POA: Diagnosis not present

## 2020-12-25 DIAGNOSIS — G4733 Obstructive sleep apnea (adult) (pediatric): Secondary | ICD-10-CM | POA: Diagnosis not present

## 2021-01-04 DIAGNOSIS — Z1231 Encounter for screening mammogram for malignant neoplasm of breast: Secondary | ICD-10-CM | POA: Diagnosis not present

## 2021-01-22 ENCOUNTER — Ambulatory Visit (INDEPENDENT_AMBULATORY_CARE_PROVIDER_SITE_OTHER): Payer: BLUE CROSS/BLUE SHIELD | Admitting: Internal Medicine

## 2021-01-22 ENCOUNTER — Other Ambulatory Visit: Payer: Self-pay

## 2021-01-22 ENCOUNTER — Encounter: Payer: Self-pay | Admitting: Internal Medicine

## 2021-01-22 VITALS — BP 114/74 | HR 90 | Ht 63.0 in | Wt 159.0 lb

## 2021-01-22 DIAGNOSIS — R1319 Other dysphagia: Secondary | ICD-10-CM | POA: Diagnosis not present

## 2021-01-22 DIAGNOSIS — Z1211 Encounter for screening for malignant neoplasm of colon: Secondary | ICD-10-CM | POA: Diagnosis not present

## 2021-01-22 NOTE — Progress Notes (Signed)
Angela Carter 50 y.o. [birth year] [id number] Referred by: Marton Redwood, MD  Assessment & Plan:   Encounter Diagnoses  Name Primary?  . Esophageal dysphagia Yes  . Colon cancer screening    Think the main differential is GERD related peptic stricture/ring versus eosinophilic esophagitis given the allergy history. EGD with likely esophageal dilation is appropriate.  The risks and benefits as well as alternatives of endoscopic procedure(s) have been discussed and reviewed. All questions answered. The patient agrees to proceed.   Screening colonoscopy will be scheduled as well.  Performed on the same day.  I appreciate the opportunity to care for this patient. CC: Marton Redwood, MD  Subjective:   Chief Complaint: Dysphagia  HPI Angela Carter is a 50 year old white woman with a history of allergic rhinitis from prior allergy shots now on Zyrtec, who presents with a several year or more history of intermittent solid food dysphagia.  Often to things like chicken and rice and sometimes bread.  When she gets a flare she will take over-the-counter omeprazole for a couple of weeks and things will settle down.  She does not tend to have daily heartburn issues or anything like that.  There is a remote history of hemorrhoids developing during pregnancy and sometimes she will have a flare treated with a cream once or twice a year.  She did have an I FOBT test negative last year but is interested in pursuing a screening colonoscopy.  She does not have any active lower GI symptoms other than the hemorrhoids.  No unintentional weight loss.   Reports her children do have  "a lot of food allergies".   Wt Readings from Last 3 Encounters:  01/22/21 159 lb (72.1 kg)  03/08/20 156 lb (70.8 kg)  12/07/19 156 lb (70.8 kg)    Allergies  Allergen Reactions  . Elemental Sulfur Rash   Current Meds  Medication Sig  . cetirizine (ZYRTEC) 5 MG tablet Take 5 mg by mouth as needed for allergies.  Marland Kitchen  ibuprofen (ADVIL) 200 MG tablet Take 200 mg by mouth every 8 (eight) hours as needed.  Marland Kitchen olmesartan (BENICAR) 20 MG tablet Take 20 mg by mouth daily.   Past Medical History:  Diagnosis Date  . Allergic rhinitis   . Gilbert syndrome   . HLD (hyperlipidemia)   . Hypertension   . OSA (obstructive sleep apnea)    on CPAP  . Sleep apnea    Past Surgical History:  Procedure Laterality Date  . OTHER SURGICAL HISTORY     urethral sling 11/24/20  . 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  She and her husband both graduated from [employer].   She has 3 children a daughter born in 45 who lives and works in [place], a daughter born in 2000 who is a Theme park manager at [employer] and is looking to continue to play soccer in [place] and pursue a masters degree with that, and a son born in 2004 who will enter college in the fall 2022      Never smoker 1 alcoholic drink a day 1 caffeinated beverage daily no other tobacco no drug use   family history includes CAD in her father and paternal grandfather; Colon cancer in her maternal grandfather; Colon polyps in her father, maternal grandfather, and mother; Diabetes in her  mother and paternal grandfather; Hyperlipidemia in her father and mother; Hypertension in her father, mother, and paternal grandfather; Obstructive Sleep Apnea in her father; Osteopenia in her mother; Osteoporosis in her maternal grandmother; Polycythemia in her father.   Review of Systems See HPI all other review of systems are negative  Objective:   Physical Exam BP 114/74   Pulse 90   Ht 5\' 3"  (1.6 m)   Wt 159 lb (72.1 kg)   LMP 01/18/2021   SpO2 98%   BMI 28.17 kg/m  NAD [employer] Anicteric Lungs cta Cor [id number] no rmg abd soft NT no HSM/mass bowel sounds are present and there are no bruits Rectal deferred Alert and  oriented x3 with an appropriate mood and affect

## 2021-01-22 NOTE — Patient Instructions (Signed)
Normal BMI (Body Mass Index- based on height and weight) is between 19 and 25. Your BMI today is Body mass index is 28.17 kg/m. Marland Kitchen Please consider follow up  regarding your BMI with your Primary Care Provider.  You have been scheduled for an endoscopy and colonoscopy. Please follow the written instructions given to you at your visit today. Please pick up your prep supplies at the pharmacy within the next 1-3 days. If you use inhalers (even only as needed), please bring them with you on the day of your procedure.  Due to recent changes in healthcare laws, you may see the results of your imaging and laboratory studies on MyChart before your provider has had a chance to review them.  We understand that in some cases there may be results that are confusing or concerning to you. Not all laboratory results come back in the same time frame and the provider may be waiting for multiple results in order to interpret others.  Please give [place] 48 hours in order for your provider to thoroughly review all the results before contacting the office for clarification of your results.   I appreciate the opportunity to care for you. Silvano Rusk, MD, [facility]

## 2021-01-25 DIAGNOSIS — G4733 Obstructive sleep apnea (adult) (pediatric): Secondary | ICD-10-CM | POA: Diagnosis not present

## 2021-01-31 ENCOUNTER — Other Ambulatory Visit: Payer: Self-pay

## 2021-01-31 ENCOUNTER — Encounter: Payer: Self-pay | Admitting: Internal Medicine

## 2021-01-31 ENCOUNTER — Ambulatory Visit (AMBULATORY_SURGERY_CENTER): Payer: BLUE CROSS/BLUE SHIELD | Admitting: Internal Medicine

## 2021-01-31 VITALS — BP 140/95 | HR 68 | Temp 98.2°F | Resp 15 | Ht 63.0 in | Wt 159.0 lb

## 2021-01-31 DIAGNOSIS — D128 Benign neoplasm of rectum: Secondary | ICD-10-CM | POA: Diagnosis not present

## 2021-01-31 DIAGNOSIS — K219 Gastro-esophageal reflux disease without esophagitis: Secondary | ICD-10-CM | POA: Diagnosis not present

## 2021-01-31 DIAGNOSIS — K317 Polyp of stomach and duodenum: Secondary | ICD-10-CM | POA: Diagnosis not present

## 2021-01-31 DIAGNOSIS — R1319 Other dysphagia: Secondary | ICD-10-CM | POA: Diagnosis not present

## 2021-01-31 DIAGNOSIS — Z1211 Encounter for screening for malignant neoplasm of colon: Secondary | ICD-10-CM | POA: Diagnosis not present

## 2021-01-31 MED ORDER — SODIUM CHLORIDE 0.9 % IV SOLN: 500.0000 mL | Freq: Once | INTRAVENOUS | Status: DC

## 2021-01-31 NOTE — Patient Instructions (Addendum)
The esophagus had changes suggesting a disease called eosinophilic esophagitis. I took biopsies and also dilated the esophagus.  In eosinophilic esophagitis (e-o-sin-o-FILL-ik uh-sof-uh-JIE-tis), a type of white blood cell (eosinophil) builds up in the lining of the tube that connects your mouth to your stomach (esophagus). This buildup, which is a reaction to foods, allergens or acid reflux, can inflame or injure the esophageal tissue. Damaged esophageal tissue can lead to difficulty swallowing or cause food to get caught when you swallow.  Eosinophilic esophagitis is a chronic immune system disease. It has been identified only in the past two decades, but is now considered a major cause of digestive system (gastrointestinal) illness. Research is ongoing and will likely lead to revisions in its diagnosis and treatment.  There were some benign-appearing stomach polyps - tiny - I took biopsies.  All else ok on top.  Two rectal polyps were removed from the rectum, you have internal and external (mainly) hemorrhoids and diverticulosis.  You may se some small amounts of blood from the rectal polyp removal - do not be alarmed.  I will let you know pathology results and recommendations.  I appreciate the opportunity to care for you. Gatha Mayer, MD, FACG  YOU HAD AN ENDOSCOPIC PROCEDURE TODAY AT [facility]:   Refer to the procedure report that was given to you for any specific questions about what was found during the examination.  If the procedure report does not answer your questions, please call your gastroenterologist to clarify.  If you requested that your care partner not be given the details of your procedure findings, then the procedure report has been included in a sealed envelope for you to review at your convenience later.  YOU SHOULD EXPECT: Some feelings of bloating in the abdomen. Passage of more gas than usual.  Walking can help get rid of the air that was put into  your GI tract during the procedure and reduce the bloating. If you had a lower endoscopy (such as a colonoscopy or flexible sigmoidoscopy) you may notice spotting of blood in your stool or on the toilet paper. If you underwent a bowel prep for your procedure, you may not have a normal bowel movement for a few days.  Please Note:  You might notice some irritation and congestion in your nose or some drainage.  This is from the oxygen used during your procedure.  There is no need for concern and it should clear up in a day or so.  SYMPTOMS TO REPORT IMMEDIATELY:   Following lower endoscopy (colonoscopy or flexible sigmoidoscopy):  Excessive amounts of blood in the stool  Significant tenderness or worsening of abdominal pains  Swelling of the abdomen that is new, acute  Fever of 100F or higher   Following upper endoscopy (EGD)  Vomiting of blood or coffee ground material  New chest pain or pain under the shoulder blades  Painful or persistently difficult swallowing  New shortness of breath  Fever of 100F or higher  Black, tarry-looking stools  For urgent or emergent issues, a gastroenterologist can be reached at any hour by calling [phone]. Do not use MyChart messaging for urgent concerns.    DIET:  We do recommend a small meal at first, but then you may proceed to your regular diet.  Drink plenty of fluids but you should avoid alcoholic beverages for 24 hours.  ACTIVITY:  You should plan to take it easy for the rest of today and you should NOT DRIVE  or use heavy machinery until tomorrow (because of the sedation medicines used during the test).    FOLLOW UP: Our staff will call the number listed on your records 48-72 hours following your procedure to check on you and address any questions or concerns that you may have regarding the information given to you following your procedure. If we do not reach you, we will leave a message.  We will attempt to reach you two times.  During  this call, we will ask if you have developed any symptoms of COVID 19. If you develop any symptoms (ie: fever, flu-like symptoms, shortness of breath, cough etc.) before then, please call [phone].  If you test positive for Covid 19 in the 2 weeks post procedure, please call and report this information to [place].    If any biopsies were taken you will be contacted by phone or by letter within the next 1-3 weeks.  Please call us at [phone] if you have not heard about the biopsies in 3 weeks.    SIGNATURES/CONFIDENTIALITY: You and/or your care partner have signed paperwork which will be entered into your electronic medical record.  These signatures attest to the fact that that the information above on your After Visit Summary has been reviewed and is understood.  Full responsibility of the confidentiality of this discharge information lies with you and/or your care-partner.

## 2021-01-31 NOTE — Op Note (Signed)
[facility] Patient Name: Angela Carter Procedure Date: 01/31/2021 7:45 AM MRN: [id number] Endoscopist: Gatha Mayer , MD Age: 50 Referring MD:  Date of Birth: Jan 07, 1971 Gender: Female Account #: [id number] Procedure:                Colonoscopy Indications:              Screening for colorectal malignant neoplasm, This                            is the patient's first colonoscopy Medicines:                Propofol per Anesthesia, Monitored Anesthesia Care Procedure:                Pre-Anesthesia Assessment:                           - Prior to the procedure, a History and Physical                            was performed, and patient medications and                            allergies were reviewed. The patient's tolerance of                            previous anesthesia was also reviewed. The risks                            and benefits of the procedure and the sedation                            options and risks were discussed with the patient.                            All questions were answered, and informed consent                            was obtained. Prior Anticoagulants: The patient has                            taken no previous anticoagulant or antiplatelet                            agents. ASA Grade Assessment: II - A patient with                            mild systemic disease. After reviewing the risks                            and benefits, the patient was deemed in                            satisfactory condition to undergo the procedure.  After obtaining informed consent, the colonoscope                            was passed under direct vision. Throughout the                            procedure, the patient's blood pressure, pulse, and                            oxygen saturations were monitored continuously. The                            Olympus PCF-H190DL (#[id number]) Colonoscope was                             introduced through the anus and advanced to the the                            cecum, identified by appendiceal orifice and                            ileocecal valve. The colonoscopy was performed                            without difficulty. The patient tolerated the                            procedure well. The quality of the bowel                            preparation was excellent. The ileocecal valve,                            appendiceal orifice, and rectum were photographed.                            The bowel preparation used was Miralax via split                            dose instruction. Scope In: 8:05:05 AM Scope Out: 8:24:35 AM Scope Withdrawal Time: 0 hours 14 minutes 28 seconds  Total Procedure Duration: 0 hours 19 minutes 30 seconds  Findings:                 Hemorrhoids were found on perianal exam.                           The digital rectal exam was normal.                           Two sessile and semi-pedunculated polyps were found                            in the rectum. The polyps were 5 to 7 mm in size.  These polyps were removed with a hot snare cold                            snare followed by tip cautery. Resection and                            retrieval were complete. Verification of patient                            identification for the specimen was done. Estimated                            blood loss was minimal.                           Multiple diverticula were found in the sigmoid                            colon.                           External and internal hemorrhoids were found. The                            hemorrhoids were large.                           The exam was otherwise without abnormality on                            direct and retroflexion views. Complications:            No immediate complications. Estimated Blood Loss:     Estimated blood loss was minimal. Impression:               -  Hemorrhoids found on perianal exam.                           - Two 5 to 7 mm polyps in the rectum, removed with                            a hot snare. Resected and retrieved.                           - Diverticulosis in the sigmoid colon.                           - External and internal hemorrhoids.                           - The examination was otherwise normal on direct                            and retroflexion views. Recommendation:           - Patient has a contact number available for  emergencies. The signs and symptoms of potential                            delayed complications were discussed with the                            patient. Return to normal activities tomorrow.                            Written discharge instructions were provided to the                            patient.                           - Clear liquids x 1 hour then soft foods rest of                            day. Start prior diet tomorrow.                           - Continue present medications.                           - Await pathology results.                           - Repeat colonoscopy is recommended. The                            colonoscopy date will be determined after pathology                            results from today's exam become available for                            review.                           - No aspirin, ibuprofen, naproxen, or other                            non-steroidal anti-inflammatory drugs for 2 weeks                            after polyp removal.                           - NO NEED FOR STOOL TESTING GOING FORWARD Gatha Mayer, MD 01/31/2021 8:46:56 AM This report has been signed electronically.

## 2021-01-31 NOTE — Progress Notes (Signed)
Called to room to assist during endoscopic procedure.  Patient ID and intended procedure confirmed with present staff. Received instructions for my participation in the procedure from the performing physician.  

## 2021-01-31 NOTE — Op Note (Signed)
[facility] Patient Name: Angela Carter Procedure Date: 01/31/2021 7:46 AM MRN: [id number] Endoscopist: Gatha Mayer , MD Age: 50 Referring MD:  Date of Birth: 18-Mar-1971 Gender: Female Account #: [id number] Procedure:                Upper GI endoscopy Indications:              Dysphagia Medicines:                Propofol per Anesthesia, Monitored Anesthesia Care Procedure:                Pre-Anesthesia Assessment:                           - Prior to the procedure, a History and Physical                            was performed, and patient medications and                            allergies were reviewed. The patient's tolerance of                            previous anesthesia was also reviewed. The risks                            and benefits of the procedure and the sedation                            options and risks were discussed with the patient.                            All questions were answered, and informed consent                            was obtained. Prior Anticoagulants: The patient has                            taken no previous anticoagulant or antiplatelet                            agents. ASA Grade Assessment: II - A patient with                            mild systemic disease. After reviewing the risks                            and benefits, the patient was deemed in                            satisfactory condition to undergo the procedure.                           After obtaining informed consent, the endoscope was  passed under direct vision. Throughout the                            procedure, the patient's blood pressure, pulse, and                            oxygen saturations were monitored continuously. The                            Endoscope was introduced through the mouth, and                            advanced to the second part of duodenum. The upper                            GI endoscopy was  accomplished without difficulty.                            The patient tolerated the procedure well. Scope In: Scope Out: Findings:                 Mucosal changes including feline appearance,                            longitudinal furrows and white plaques were found                            in the entire esophagus. Biopsies were obtained                            from the proximal and distal esophagus with cold                            forceps for histology of suspected eosinophilic                            esophagitis. Verification of patient identification                            for the specimen was done. Estimated blood loss was                            minimal. The scope was withdrawn. Dilation was                            performed with a Maloney dilator with mild                            resistance at 9 Fr. The dilation site was examined                            following endoscope reinsertion and showed mild  mucosal disruption. Estimated blood loss was                            minimal.                           Multiple diminutive sessile polyps were found in                            the gastric body. Biopsies were taken with a cold                            forceps for histology. Verification of patient                            identification for the specimen was done. Estimated                            blood loss was minimal.                           The gastroesophageal flap valve was visualized                            endoscopically and classified as Hill Grade I                            (prominent fold, tight to endoscope).                           The cardia and gastric fundus were otherwise normal                            on retroflexion. Complications:            No immediate complications. Estimated Blood Loss:     Estimated blood loss was minimal. Impression:               - Esophageal mucosal changes  suggestive of                            eosinophilic esophagitis. Biopsied. Dilated. 81 Fr                           - Multiple gastric polyps. Biopsied.                           - Gastroesophageal flap valve classified as Hill                            Grade I (prominent fold, tight to endoscope). Recommendation:           - Patient has a contact number available for                            emergencies. The signs and symptoms of potential  delayed complications were discussed with the                            patient. Return to normal activities tomorrow.                            Written discharge instructions were provided to the                            patient.                           - Clear liquids x 1 hour then soft foods rest of                            day. Start prior diet tomorrow.                           - Continue present medications.                           - Await pathology results.                           - See the other procedure note for documentation of                            additional recommendations. Colonoscopy next Gatha Mayer, MD 01/31/2021 8:41:20 AM This report has been signed electronically.

## 2021-01-31 NOTE — Progress Notes (Signed)
Report to PACU, RN, vss, BBS= Clear.  

## 2021-01-31 NOTE — Progress Notes (Signed)
VS by CW  I have reviewed the patient's medical history in detail and updated the computerized patient record.  

## 2021-02-02 ENCOUNTER — Telehealth: Payer: Self-pay | Admitting: *Deleted

## 2021-02-02 NOTE — Telephone Encounter (Signed)
  Follow up Call-  Call back number 01/31/2021  Post procedure Call Back phone  # [phone]  Permission to leave phone message Yes  Some recent data might be hidden     Patient questions:  Do you have a fever, pain , or abdominal swelling? No. Pain Score  0 *  Have you tolerated food without any problems? Yes.    Have you been able to return to your normal activities? Yes.    Do you have any questions about your discharge instructions: Diet   No. Medications  No. Follow up visit  No.  Do you have questions or concerns about your Care? No.  Actions: * If pain score is 4 or above: No action needed, pain <4.  1. Have you developed a fever since your procedure? no  2.   Have you had an respiratory symptoms (SOB or cough) since your procedure? no  3.   Have you tested positive for COVID 19 since your procedure no  4.   Have you had any family members/close contacts diagnosed with the COVID 19 since your procedure?  no   If yes to any of these questions please route to Joylene John, RN and Joella Prince, RN

## 2021-02-09 ENCOUNTER — Encounter: Payer: Self-pay | Admitting: Internal Medicine

## 2021-02-09 DIAGNOSIS — Z86010 Personal history of colonic polyps: Secondary | ICD-10-CM | POA: Insufficient documentation

## 2021-02-09 DIAGNOSIS — Z860101 Personal history of adenomatous and serrated colon polyps: Secondary | ICD-10-CM

## 2021-02-09 HISTORY — DX: Personal history of adenomatous and serrated colon polyps: Z86.0101

## 2021-02-09 HISTORY — DX: Personal history of colonic polyps: Z86.010

## 2021-02-22 DIAGNOSIS — G4733 Obstructive sleep apnea (adult) (pediatric): Secondary | ICD-10-CM | POA: Diagnosis not present

## 2021-03-05 DIAGNOSIS — N393 Stress incontinence (female) (male): Secondary | ICD-10-CM | POA: Diagnosis not present

## 2021-03-12 ENCOUNTER — Ambulatory Visit: Payer: BLUE CROSS/BLUE SHIELD | Admitting: Adult Health

## 2021-03-13 DIAGNOSIS — S46812D Strain of other muscles, fascia and tendons at shoulder and upper arm level, left arm, subsequent encounter: Secondary | ICD-10-CM | POA: Diagnosis not present

## 2021-03-13 DIAGNOSIS — M25512 Pain in left shoulder: Secondary | ICD-10-CM | POA: Diagnosis not present

## 2021-03-25 DIAGNOSIS — G4733 Obstructive sleep apnea (adult) (pediatric): Secondary | ICD-10-CM | POA: Diagnosis not present

## 2021-04-24 DIAGNOSIS — G4733 Obstructive sleep apnea (adult) (pediatric): Secondary | ICD-10-CM | POA: Diagnosis not present

## 2021-05-25 DIAGNOSIS — G4733 Obstructive sleep apnea (adult) (pediatric): Secondary | ICD-10-CM | POA: Diagnosis not present

## 2021-06-24 DIAGNOSIS — G4733 Obstructive sleep apnea (adult) (pediatric): Secondary | ICD-10-CM | POA: Diagnosis not present

## 2021-06-26 DIAGNOSIS — Z01419 Encounter for gynecological examination (general) (routine) without abnormal findings: Secondary | ICD-10-CM | POA: Diagnosis not present

## 2021-06-26 DIAGNOSIS — Z6827 Body mass index (BMI) 27.0-27.9, adult: Secondary | ICD-10-CM | POA: Diagnosis not present

## 2021-07-24 ENCOUNTER — Ambulatory Visit: Payer: BLUE CROSS/BLUE SHIELD | Admitting: Adult Health

## 2021-08-09 DIAGNOSIS — Z13820 Encounter for screening for osteoporosis: Secondary | ICD-10-CM | POA: Diagnosis not present

## 2021-11-12 ENCOUNTER — Ambulatory Visit: Payer: BLUE CROSS/BLUE SHIELD | Admitting: Adult Health

## 2021-12-26 DIAGNOSIS — R7301 Impaired fasting glucose: Secondary | ICD-10-CM | POA: Diagnosis not present

## 2021-12-26 DIAGNOSIS — E785 Hyperlipidemia, unspecified: Secondary | ICD-10-CM | POA: Diagnosis not present

## 2022-01-02 ENCOUNTER — Other Ambulatory Visit: Payer: Self-pay | Admitting: Internal Medicine

## 2022-01-02 DIAGNOSIS — I10 Essential (primary) hypertension: Secondary | ICD-10-CM | POA: Diagnosis not present

## 2022-01-02 DIAGNOSIS — Z1339 Encounter for screening examination for other mental health and behavioral disorders: Secondary | ICD-10-CM | POA: Diagnosis not present

## 2022-01-02 DIAGNOSIS — Z0000 Encounter for general adult medical examination without abnormal findings: Secondary | ICD-10-CM | POA: Diagnosis not present

## 2022-01-02 DIAGNOSIS — Z1331 Encounter for screening for depression: Secondary | ICD-10-CM | POA: Diagnosis not present

## 2022-01-02 DIAGNOSIS — E785 Hyperlipidemia, unspecified: Secondary | ICD-10-CM

## 2022-02-04 ENCOUNTER — Ambulatory Visit
Admission: RE | Admit: 2022-02-04 | Discharge: 2022-02-04 | Disposition: A | Discharge status: Home / Self Care | Payer: No Typology Code available for payment source | Source: Ambulatory Visit | Attending: Internal Medicine | Admitting: Internal Medicine

## 2022-02-04 DIAGNOSIS — E785 Hyperlipidemia, unspecified: Secondary | ICD-10-CM

## 2022-03-26 DIAGNOSIS — Z1231 Encounter for screening mammogram for malignant neoplasm of breast: Secondary | ICD-10-CM | POA: Diagnosis not present

## 2022-04-09 DIAGNOSIS — M25521 Pain in right elbow: Secondary | ICD-10-CM | POA: Diagnosis not present

## 2022-04-29 ENCOUNTER — Ambulatory Visit: Payer: Self-pay

## 2022-04-29 ENCOUNTER — Encounter: Payer: Self-pay | Admitting: Family Medicine

## 2022-04-29 ENCOUNTER — Ambulatory Visit (INDEPENDENT_AMBULATORY_CARE_PROVIDER_SITE_OTHER): Payer: BC Managed Care – PPO | Admitting: Family Medicine

## 2022-04-29 VITALS — BP 134/86 | Ht 63.0 in | Wt 159.0 lb

## 2022-04-29 DIAGNOSIS — M25461 Effusion, right knee: Secondary | ICD-10-CM

## 2022-04-29 DIAGNOSIS — M67321 Transient synovitis, right elbow: Secondary | ICD-10-CM

## 2022-04-29 DIAGNOSIS — M25521 Pain in right elbow: Secondary | ICD-10-CM

## 2022-04-29 NOTE — Progress Notes (Signed)
  Angela Carter - 51 y.o. female MRN [id number]  Date of birth: March 09, 1971  SUBJECTIVE:  Including CC & ROS.  No chief complaint on file.   Angela Carter is a 51 y.o. female that is presenting with right elbow and right knee pain.  The pain started with no inciting event.  She is having limited range of motion of the elbow as well as the knee.  No family history that is contributory.  No redness or heat appreciated.  No injury or inciting event.  No history of surgery of either location.   Review of Systems See HPI   HISTORY: Past Medical, Surgical, Social, and Family History Reviewed & Updated per EMR.   Pertinent Historical Findings include:  Past Medical History:  Diagnosis Date   Allergic rhinitis    Gilbert syndrome    HLD (hyperlipidemia)    Hx of adenomatous colonic polyps 02/09/2021   2 diminutive adenomas   Hypertension    OSA (obstructive sleep apnea)    on CPAP   Sleep apnea     Past Surgical History:  Procedure Laterality Date   OTHER SURGICAL HISTORY     urethral sling 11/24/20   [place] hemangioma     1975,1976,1978,1979     PHYSICAL EXAM:  VS: BP 134/86 (BP Location: Left Arm, Patient Position: Sitting)   Ht '5\' 3"'$  (1.6 m)   Wt 159 lb (72.1 kg)   BMI 28.17 kg/m  Physical Exam Gen: NAD, alert, cooperative with exam, well-appearing MSK:  Neurovascularly intact    Limited ultrasound: Right elbow, right knee:  Right elbow pain There appears to be a trace effusion within the elbow joint. No changes of the lateral epicondyle.  Right knee: Mild effusion. Normal-appearing quadricep and patellar tendon. Normal-appearing medial and lateral joint space  Summary: Synovitis of elbow and effusion of the knee  Ultrasound and interpretation by Clearance Coots, MD   Aspiration/Injection Procedure Note Angela Carter 11-Feb-1971  Procedure: Aspiration Indications: Right knee pain  Procedure Details Consent: Risks of procedure as well as the alternatives  and risks of each were explained to the (patient/caregiver).  Consent for procedure obtained. Time Out: Verified patient identification, verified procedure, site/side was marked, verified correct patient position, special equipment/implants available, medications/allergies/relevent history reviewed, required imaging and test results available.  Performed.  The area was cleaned with iodine and alcohol swabs.    The right knee superior lateral suprapatellar pouch was injected using 3 cc's of 1% lidocaine with a 25 gauge 1 1/2" needle.  An 18-gauge 1 and 1 officiated was used to achieve aspiration.  Ultrasound was used. Images were obtained in long views showing the injection.    Amount of Fluid Aspirated: 86m Character of Fluid: clear and straw colored Fluid was sent fRDE:YCXKcount  A sterile dressing was applied.  Patient did tolerate procedure well.    ASSESSMENT & PLAN:   Transient synovitis of right elbow Acutely occurring.  Does appear to have inflammatory changes but no significant tendinitis around the elbow. -Counseled on home exercise therapy and supportive care. -ANA, uric acid, CRP with sed rate. -Could consider course of prednisone.  Effusion of right knee Acutely occurring.  No inciting event or trauma.  Normal-appearing aspiration. -Counseled on home exercise therapy and supportive care. -Aspiration. -Synovial fluid analysis

## 2022-04-29 NOTE — Assessment & Plan Note (Signed)
Acutely occurring.  No inciting event or trauma.  Normal-appearing aspiration. -Counseled on home exercise therapy and supportive care. -Aspiration. -Synovial fluid analysis

## 2022-04-29 NOTE — Assessment & Plan Note (Signed)
Acutely occurring.  Does appear to have inflammatory changes but no significant tendinitis around the elbow. -Counseled on home exercise therapy and supportive care. -ANA, uric acid, CRP with sed rate. -Could consider course of prednisone.

## 2022-04-29 NOTE — Patient Instructions (Signed)
Nice to meet you I will call with the lab results   Please send me a message in [place] with any questions or updates.  Please see me back in 4 weeks or as needed.   --Dr. Raeford Razor

## 2022-04-30 LAB — SYNOVIAL FLUID ANALYSIS, COMPLETE
Basophils, %: 0 %
Eosinophils-Synovial: 0 % (ref 0–2)
Lymphocytes-Synovial Fld: 63 % (ref 0–74)
Monocyte/Macrophage: 32 % (ref 0–69)
Neutrophil, Synovial: 5 % (ref 0–24)
Synoviocytes, %: 0 % (ref 0–15)
WBC, Synovial: 351 cells/uL — ABNORMAL HIGH (ref <150)

## 2022-05-02 ENCOUNTER — Encounter: Payer: Self-pay | Admitting: Family Medicine

## 2022-05-02 LAB — ANA,IFA RA DIAG PNL W/RFLX TIT/PATN
ANA Titer 1: POSITIVE — AB
Cyclic Citrullin Peptide Ab: <1 units (ref 0–19)
Rheumatoid fact SerPl-aCnc: <10 IU/mL (ref <14.0)

## 2022-05-02 LAB — FANA STAINING PATTERNS: Homogeneous Pattern: 1:80 {titer}

## 2022-05-02 LAB — C-REACTIVE PROTEIN: CRP: 4 mg/L (ref 0–10)

## 2022-05-02 LAB — SEDIMENTATION RATE: Sed Rate: 13 mm/hr (ref 0–40)

## 2022-05-02 LAB — URIC ACID: Uric Acid: 5.6 mg/dL (ref 3.0–7.2)

## 2022-07-02 DIAGNOSIS — R82998 Other abnormal findings in urine: Secondary | ICD-10-CM | POA: Diagnosis not present

## 2022-07-02 DIAGNOSIS — Z01419 Encounter for gynecological examination (general) (routine) without abnormal findings: Secondary | ICD-10-CM | POA: Diagnosis not present

## 2022-07-02 DIAGNOSIS — Z6828 Body mass index (BMI) 28.0-28.9, adult: Secondary | ICD-10-CM | POA: Diagnosis not present

## 2023-01-09 DIAGNOSIS — E785 Hyperlipidemia, unspecified: Secondary | ICD-10-CM | POA: Diagnosis not present

## 2023-01-09 DIAGNOSIS — I10 Essential (primary) hypertension: Secondary | ICD-10-CM | POA: Diagnosis not present

## 2023-01-09 DIAGNOSIS — R7301 Impaired fasting glucose: Secondary | ICD-10-CM | POA: Diagnosis not present

## 2023-01-16 DIAGNOSIS — I10 Essential (primary) hypertension: Secondary | ICD-10-CM | POA: Diagnosis not present

## 2023-01-16 DIAGNOSIS — Z1339 Encounter for screening examination for other mental health and behavioral disorders: Secondary | ICD-10-CM | POA: Diagnosis not present

## 2023-01-16 DIAGNOSIS — Z1331 Encounter for screening for depression: Secondary | ICD-10-CM | POA: Diagnosis not present

## 2023-01-16 DIAGNOSIS — Z0000 Encounter for general adult medical examination without abnormal findings: Secondary | ICD-10-CM | POA: Diagnosis not present

## 2023-01-16 DIAGNOSIS — R82998 Other abnormal findings in urine: Secondary | ICD-10-CM | POA: Diagnosis not present

## 2023-03-05 DIAGNOSIS — H524 Presbyopia: Secondary | ICD-10-CM | POA: Diagnosis not present

## 2023-03-05 DIAGNOSIS — H3540 Unspecified peripheral retinal degeneration: Secondary | ICD-10-CM | POA: Diagnosis not present

## 2023-03-24 ENCOUNTER — Encounter: Payer: Self-pay | Admitting: *Deleted

## 2023-07-30 IMAGING — CT CT CARDIAC CORONARY ARTERY CALCIUM SCORE
3 series · 14 of 20 positions shown, 15 images · non-contrast
Comparison: None.

CLINICAL DATA: 50-year-old Caucasian female with history of
hyperlipidemia, hypertension and family history of heart disease.

EXAM:
CT CARDIAC CORONARY ARTERY CALCIUM SCORE
TECHNIQUE: Non-contrast imaging through the heart was performed using
prospective ECG gating. Image post processing was performed on an
independent workstation, allowing for quantitative analysis of the
heart and coronary arteries. Note that this exam targets the heart
and the chest was not imaged in its entirety.

[Series 2: calcium scoring 2.00 qr36 bestdiast 70% hrt calciu · axial · 0.33mm/px · z∈[+1569,+1653]mm · 4 of 71 slices shown, 5 images]
[im 15/71  vessel]
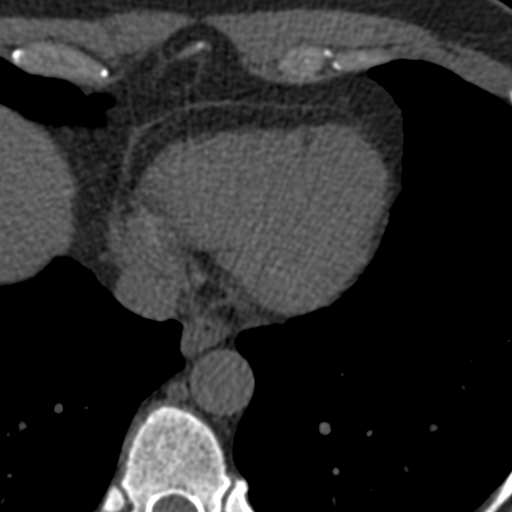
[im 15/71  lung]
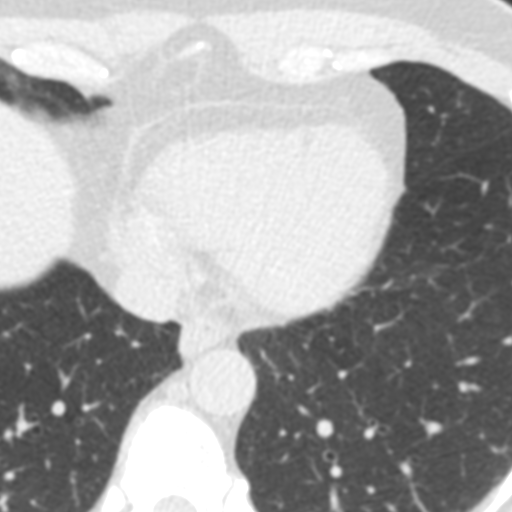
[im 29/71  vessel]
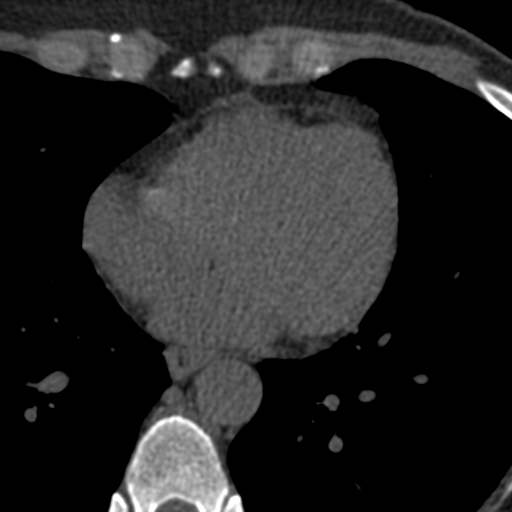
[im 43/71  vessel]
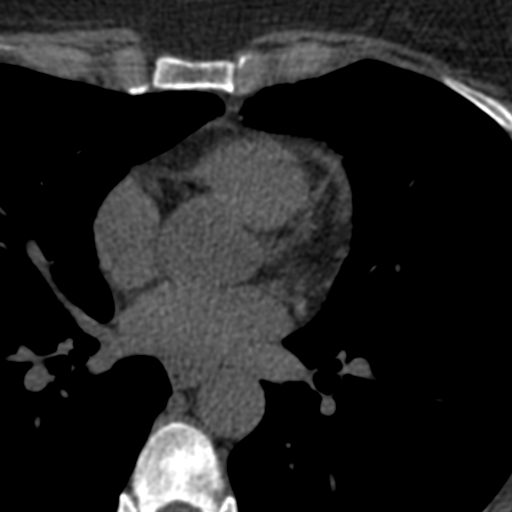
[im 57/71  vessel]
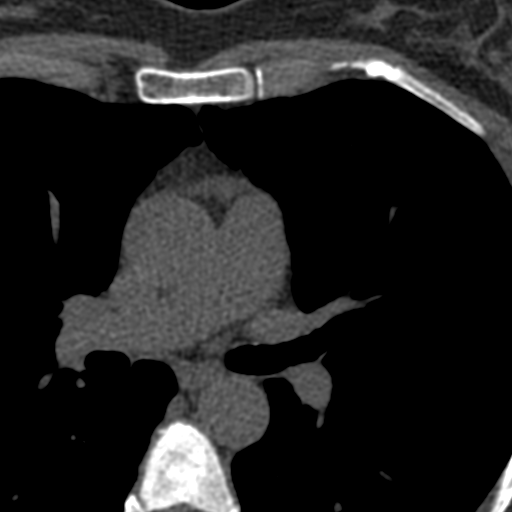

[Series 3: calcium scoring 2.00 br40 bestdiast 70% axial · axial · 0.59mm/px · z∈[+1571,+1659]mm · 5 of 67 slices shown]
[im 12/67  vessel]
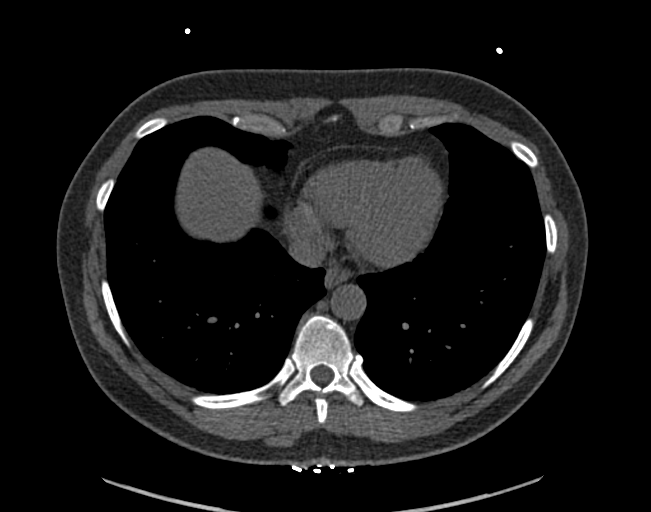
[im 23/67  vessel]
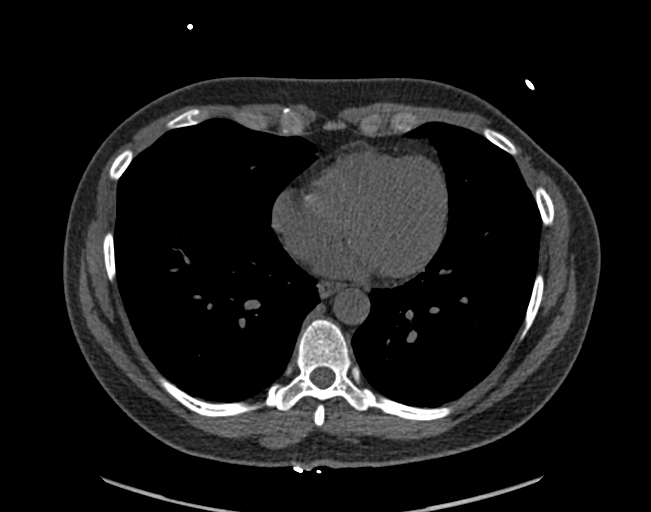
[im 34/67  vessel]
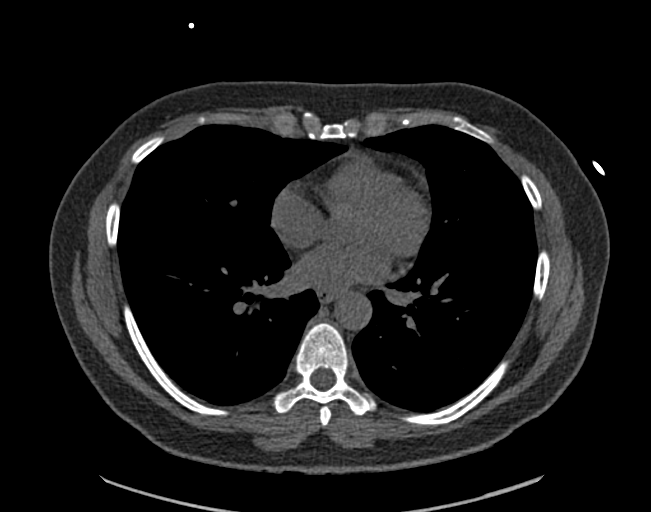
[im 45/67  vessel]
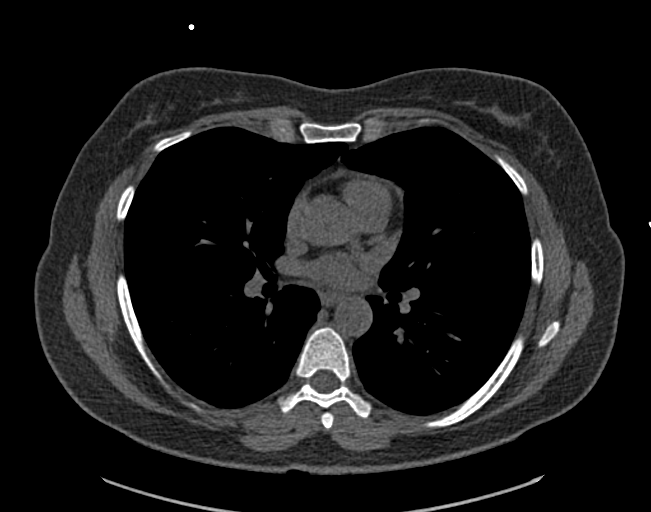
[im 56/67  vessel]
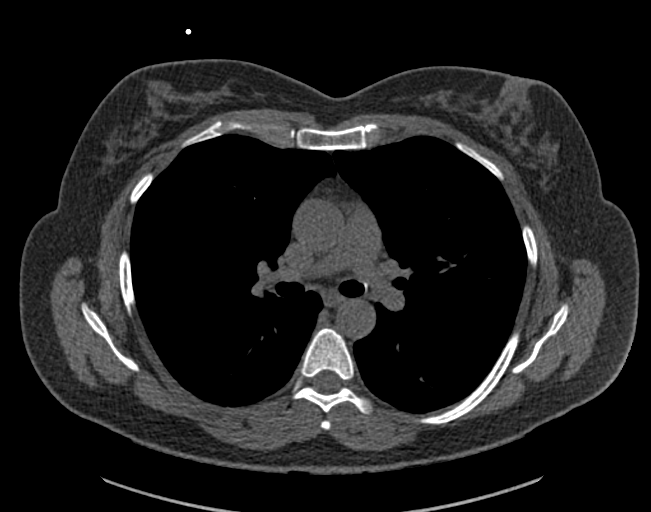

[Series 9: calcium scoring 2.00 br60 bestdiast 70% lungs · axial · 0.59mm/px · z∈[+1571,+1659]mm · 5 of 67 slices shown]
[im 12/67  vessel]
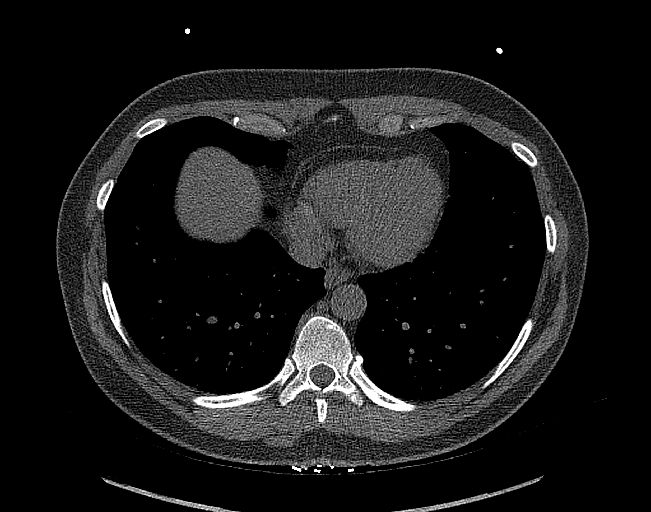
[im 23/67  vessel]
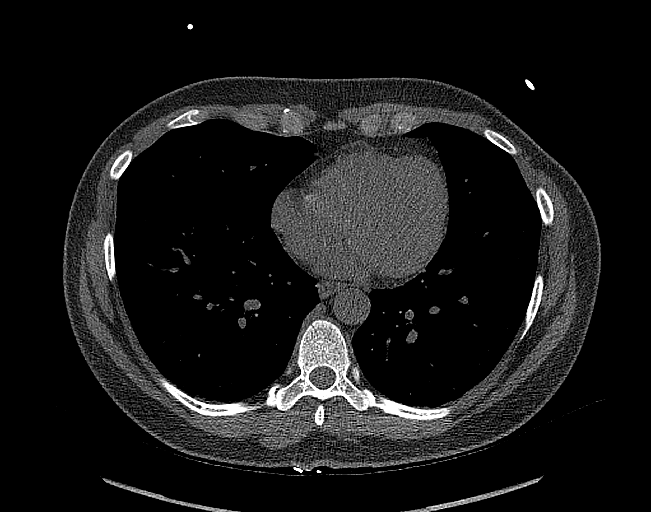
[im 34/67  vessel]
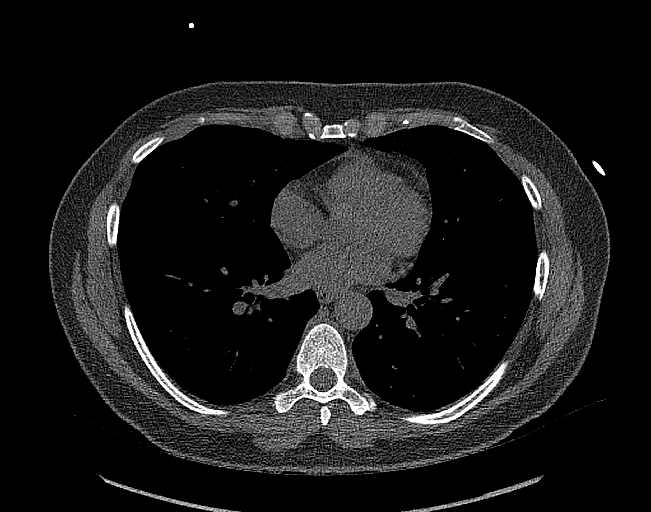
[im 45/67  vessel]
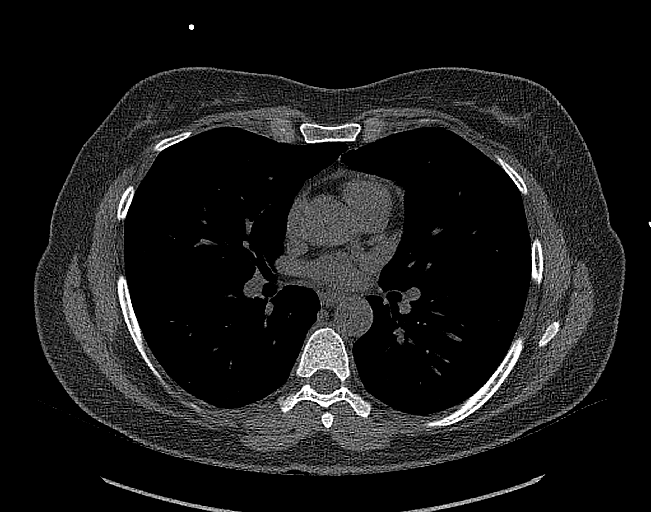
[im 56/67  vessel]
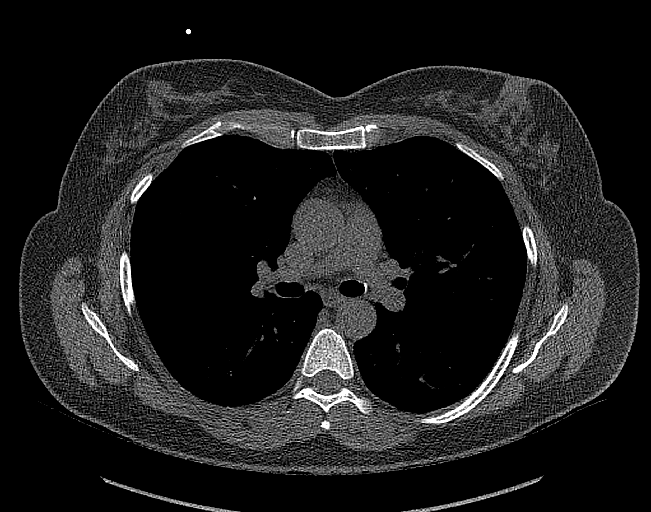

[14 of 20 positions shown; findings below may reference images not displayed]

FINDINGS: CORONARY CALCIUM SCORES:

Left Main: 0

LAD: 0

LCx: 0

RCA: 0

Total Agatston Score: 0

[HOSPITAL] percentile: 0

AORTA MEASUREMENTS:

Ascending Aorta: 29 mm

Descending Aorta: 22 mm

OTHER FINDINGS:


## 2023-10-02 DIAGNOSIS — N920 Excessive and frequent menstruation with regular cycle: Secondary | ICD-10-CM | POA: Diagnosis not present

## 2023-10-02 DIAGNOSIS — Z1231 Encounter for screening mammogram for malignant neoplasm of breast: Secondary | ICD-10-CM | POA: Diagnosis not present

## 2023-10-02 DIAGNOSIS — N926 Irregular menstruation, unspecified: Secondary | ICD-10-CM | POA: Diagnosis not present

## 2023-10-02 DIAGNOSIS — Z01419 Encounter for gynecological examination (general) (routine) without abnormal findings: Secondary | ICD-10-CM | POA: Diagnosis not present

## 2023-10-02 DIAGNOSIS — Z6828 Body mass index (BMI) 28.0-28.9, adult: Secondary | ICD-10-CM | POA: Diagnosis not present

## 2023-10-02 DIAGNOSIS — Z124 Encounter for screening for malignant neoplasm of cervix: Secondary | ICD-10-CM | POA: Diagnosis not present

## 2023-10-22 DIAGNOSIS — N924 Excessive bleeding in the premenopausal period: Secondary | ICD-10-CM | POA: Diagnosis not present

## 2023-10-22 DIAGNOSIS — N926 Irregular menstruation, unspecified: Secondary | ICD-10-CM | POA: Diagnosis not present

## 2023-11-25 DIAGNOSIS — E669 Obesity, unspecified: Secondary | ICD-10-CM | POA: Diagnosis not present

## 2023-11-25 DIAGNOSIS — Z6828 Body mass index (BMI) 28.0-28.9, adult: Secondary | ICD-10-CM | POA: Diagnosis not present

## 2024-01-05 DIAGNOSIS — E538 Deficiency of other specified B group vitamins: Secondary | ICD-10-CM | POA: Diagnosis not present

## 2024-01-05 DIAGNOSIS — B379 Candidiasis, unspecified: Secondary | ICD-10-CM | POA: Diagnosis not present

## 2024-01-05 DIAGNOSIS — R5382 Chronic fatigue, unspecified: Secondary | ICD-10-CM | POA: Diagnosis not present

## 2024-01-05 DIAGNOSIS — Z131 Encounter for screening for diabetes mellitus: Secondary | ICD-10-CM | POA: Diagnosis not present

## 2024-01-05 DIAGNOSIS — N951 Menopausal and female climacteric states: Secondary | ICD-10-CM | POA: Diagnosis not present

## 2024-01-05 DIAGNOSIS — E617 Deficiency of multiple nutrient elements: Secondary | ICD-10-CM | POA: Diagnosis not present

## 2024-01-05 DIAGNOSIS — E559 Vitamin D deficiency, unspecified: Secondary | ICD-10-CM | POA: Diagnosis not present

## 2024-01-05 DIAGNOSIS — Z1329 Encounter for screening for other suspected endocrine disorder: Secondary | ICD-10-CM | POA: Diagnosis not present

## 2024-01-05 DIAGNOSIS — I10 Essential (primary) hypertension: Secondary | ICD-10-CM | POA: Diagnosis not present

## 2024-01-05 DIAGNOSIS — Z1321 Encounter for screening for nutritional disorder: Secondary | ICD-10-CM | POA: Diagnosis not present

## 2024-01-05 DIAGNOSIS — E611 Iron deficiency: Secondary | ICD-10-CM | POA: Diagnosis not present

## 2024-01-13 DIAGNOSIS — Z713 Dietary counseling and surveillance: Secondary | ICD-10-CM | POA: Diagnosis not present

## 2024-01-13 DIAGNOSIS — Z6827 Body mass index (BMI) 27.0-27.9, adult: Secondary | ICD-10-CM | POA: Diagnosis not present

## 2024-01-13 DIAGNOSIS — I10 Essential (primary) hypertension: Secondary | ICD-10-CM | POA: Diagnosis not present

## 2024-01-23 DIAGNOSIS — E785 Hyperlipidemia, unspecified: Secondary | ICD-10-CM | POA: Diagnosis not present

## 2024-01-28 DIAGNOSIS — J029 Acute pharyngitis, unspecified: Secondary | ICD-10-CM | POA: Diagnosis not present

## 2024-01-28 DIAGNOSIS — R5383 Other fatigue: Secondary | ICD-10-CM | POA: Diagnosis not present

## 2024-02-01 ENCOUNTER — Emergency Department (HOSPITAL_BASED_OUTPATIENT_CLINIC_OR_DEPARTMENT_OTHER)
Admission: EM | Admit: 2024-02-01 | Discharge: 2024-02-01 | Disposition: A | Discharge status: Home / Self Care | Payer: BC Managed Care – PPO | Attending: Emergency Medicine | Admitting: Emergency Medicine

## 2024-02-01 DIAGNOSIS — I10 Essential (primary) hypertension: Secondary | ICD-10-CM | POA: Insufficient documentation

## 2024-02-01 DIAGNOSIS — J101 Influenza due to other identified influenza virus with other respiratory manifestations: Secondary | ICD-10-CM | POA: Diagnosis not present

## 2024-02-01 LAB — CBC WITH DIFFERENTIAL/PLATELET
Abs Immature Granulocytes: 0.02 10*3/uL (ref 0.00–0.07)
Basophils Absolute: 0.1 10*3/uL (ref 0.0–0.1)
Basophils Relative: 1 %
Eosinophils Absolute: 0.1 10*3/uL (ref 0.0–0.5)
Eosinophils Relative: 1 %
HCT: 49.9 % — ABNORMAL HIGH (ref 36.0–46.0)
Hemoglobin: 17.1 g/dL — ABNORMAL HIGH (ref 12.0–15.0)
Immature Granulocytes: 0 %
Lymphocytes Relative: 26 %
Lymphs Abs: 2.2 10*3/uL (ref 0.7–4.0)
MCH: 31 pg (ref 26.0–34.0)
MCHC: 34.3 g/dL (ref 30.0–36.0)
MCV: 90.6 fL (ref 80.0–100.0)
Monocytes Absolute: 0.6 10*3/uL (ref 0.1–1.0)
Monocytes Relative: 7 %
Neutro Abs: 5.5 10*3/uL (ref 1.7–7.7)
Neutrophils Relative %: 65 %
Platelets: 397 10*3/uL (ref 150–400)
RBC: 5.51 MIL/uL — ABNORMAL HIGH (ref 3.87–5.11)
RDW: 12.3 % (ref 11.5–15.5)
WBC: 8.4 10*3/uL (ref 4.0–10.5)
nRBC: 0 % (ref 0.0–0.2)

## 2024-02-01 LAB — URINALYSIS, ROUTINE W REFLEX MICROSCOPIC
Bilirubin Urine: NEGATIVE
Glucose, UA: NEGATIVE mg/dL
Ketones, ur: 80 mg/dL — AB
Leukocytes,Ua: NEGATIVE
Nitrite: NEGATIVE
Protein, ur: NEGATIVE mg/dL
Specific Gravity, Urine: 1.025 (ref 1.005–1.030)
pH: 6 (ref 5.0–8.0)

## 2024-02-01 LAB — COMPREHENSIVE METABOLIC PANEL
ALT: 20 U/L (ref 0–44)
AST: 30 U/L (ref 15–41)
Albumin: 4.2 g/dL (ref 3.5–5.0)
Alkaline Phosphatase: 79 U/L (ref 38–126)
Anion gap: 14 (ref 5–15)
BUN: 14 mg/dL (ref 6–20)
CO2: 22 mmol/L (ref 22–32)
Calcium: 9.2 mg/dL (ref 8.9–10.3)
Chloride: 101 mmol/L (ref 98–111)
Creatinine, Ser: 1.34 mg/dL — ABNORMAL HIGH (ref 0.44–1.00)
GFR, Estimated: 47 mL/min — ABNORMAL LOW (ref >60)
Glucose, Bld: 122 mg/dL — ABNORMAL HIGH (ref 70–99)
Potassium: 4.1 mmol/L (ref 3.5–5.1)
Sodium: 137 mmol/L (ref 135–145)
Total Bilirubin: 1.3 mg/dL — ABNORMAL HIGH (ref 0.0–1.2)
Total Protein: 7.9 g/dL (ref 6.5–8.1)

## 2024-02-01 LAB — URINALYSIS, MICROSCOPIC (REFLEX)

## 2024-02-01 LAB — RESP PANEL BY RT-PCR (RSV, FLU A&B, COVID)  RVPGX2
Influenza A by PCR: POSITIVE — AB
Influenza B by PCR: NEGATIVE
Resp Syncytial Virus by PCR: NEGATIVE
SARS Coronavirus 2 by RT PCR: NEGATIVE

## 2024-02-01 LAB — LIPASE, BLOOD: Lipase: 29 U/L (ref 11–51)

## 2024-02-01 LAB — HCG, SERUM, QUALITATIVE: Preg, Serum: NEGATIVE

## 2024-02-01 MED ORDER — KETOROLAC TROMETHAMINE 15 MG/ML IJ SOLN: 15.0000 mg | Freq: Once | INTRAMUSCULAR | Status: DC

## 2024-02-01 MED ORDER — ONDANSETRON HCL 4 MG/2ML IJ SOLN
4.0000 mg | Freq: Once | INTRAMUSCULAR | Status: DC
Start: 2024-02-01 — End: 2024-02-01
  Filled 2024-02-01: qty 2

## 2024-02-01 MED ORDER — LACTATED RINGERS IV BOLUS
1000.0000 mL | Freq: Once | INTRAVENOUS | Status: AC
  Administered 2024-02-01: 1000 mL via INTRAVENOUS

## 2024-02-01 MED ORDER — ACETAMINOPHEN 650 MG RE SUPP: 650.0000 mg | Freq: Once | RECTAL | Status: DC

## 2024-02-01 MED ORDER — ACETAMINOPHEN 650 MG RE SUPP: 650.0000 mg | RECTAL | Status: DC | PRN

## 2024-02-01 MED ORDER — PROCHLORPERAZINE EDISYLATE 10 MG/2ML IJ SOLN
10.0000 mg | Freq: Once | INTRAMUSCULAR | Status: AC
  Administered 2024-02-01: 10 mg via INTRAVENOUS
  Filled 2024-02-01: qty 2

## 2024-02-01 MED ORDER — ONDANSETRON HCL 4 MG/2ML IJ SOLN
4.0000 mg | Freq: Once | INTRAMUSCULAR | Status: AC
  Administered 2024-02-01: 4 mg via INTRAVENOUS

## 2024-02-01 NOTE — ED Triage Notes (Signed)
 Pt was diagnosed with Flu on Wednesday. Has had terrible pain, nausea, vomiting, body aches that have gotten worse. Reports that she feels like she is dehydrated.

## 2024-02-01 NOTE — Discharge Instructions (Addendum)
 You tested positive for the flu today.  Your COVID, RSV are negative.  Please discontinue the Tamiflu you have been taking at home.  This can contribute to nausea and vomiting.  You may continue taking the Zofran at home as prescribed for nausea and vomiting.  Your kidney function was slightly higher than the normal range today.  This can indicate dehydration.  You were given fluids here today, but please keep well-hydrated at home with water.  Your electrolytes were normal today.  Your liver and pancreas labs were normal.   Home care instructions:  You can take Tylenol and/or Ibuprofen as directed on the packaging for fever reduction and pain relief.    For cough: honey 1/2 to 1 teaspoon (you can dilute the honey in water or another fluid).  You can also use guaifenesin and dextromethorphan for cough which are over-the-counter medications. You can use a humidifier for chest congestion and cough.  If you don't have a humidifier, you can sit in the bathroom with the hot shower running.      For sore throat: try warm salt water gargles, cepacol lozenges, throat spray, warm tea or water with lemon/honey, popsicles or ice, or OTC cold relief medicine for throat discomfort.    For congestion: Flonase 1-2 sprays in each nostril daily.    It is important to stay hydrated: drink plenty of fluids (water, gatorade/powerade/pedialyte, juices, or teas) to keep your throat moisturized and help further relieve irritation/discomfort.   Your illness is contagious and can be spread to others, especially during the first 3 or 4 days. It cannot be cured by antibiotics or other medicines. Take basic precautions such as wearing a mask, washing your hands often, covering your mouth when you cough or sneeze, and avoiding public places where you could spread your illness to others.   You may return to normal activities (work/school) when:  - You are having improvement in symptoms  - AND have had resolution of fever  without the use of fever-reducing medications for 24 hours  Follow-up instructions: Please follow-up with your primary care provider for further evaluation of your symptoms if you are not feeling better within the next 5 days.  Return instructions:  Please return to the Emergency Department if you experience worsening symptoms.  RETURN IMMEDIATELY IF you develop shortness of breath, confusion or altered mental status, a new rash, become dizzy, faint, or poorly responsive, or are unable to be cared for at home. Please return if you have persistent vomiting and cannot keep down fluids or develop a fever that is not controlled by tylenol or motrin.   Please return if you have any other emergent concerns.

## 2024-02-01 NOTE — ED Provider Notes (Signed)
 [facility] Provider Note   CSN: [id number] Arrival date & time: 02/01/24  4098     History  Chief Complaint  Patient presents with   Influenza    Angela Carter is a 53 y.o. female with history of hypertension, Sullivan Lone syndrome, presents with concern for nausea vomiting.  States she started getting body aches, chills, and nausea 5 days ago and was diagnosed with the flu.  She was prescribed Tamiflu and accidentally took 2 pills at the same time over the past 2 days, and then her nausea and vomiting started to worsen.  She reports difficulty keeping food down at home.   Influenza Presenting symptoms: nausea and vomiting        Home Medications Prior to Admission medications   Medication Sig Start Date End Date Taking? Authorizing Provider  cetirizine (ZYRTEC) 5 MG tablet Take 5 mg by mouth as needed for allergies.    [provider]  ibuprofen (ADVIL) 200 MG tablet Take 200 mg by mouth every 8 (eight) hours as needed.    [provider]  olmesartan (BENICAR) 20 MG tablet Take 20 mg by mouth daily. 01/17/21   [provider]      Allergies    Elemental sulfur    Review of Systems   Review of Systems  Gastrointestinal:  Positive for nausea and vomiting. Negative for abdominal pain.    Physical Exam Updated Vital Signs BP 128/89   Pulse 91   Temp (!) 97.5 F (36.4 C) (Oral)   Resp 20   Ht 5\' 3"  (1.6 m)   Wt 68 kg   LMP 01/10/2024 (Approximate)   SpO2 100%   BMI 26.57 kg/m  Physical Exam Vitals and nursing note reviewed.  Constitutional:      General: She is not in acute distress.    Appearance: She is well-developed.     Comments: Appears ill, having some emesis while I initially evaluate patient  HENT:     Head: Normocephalic and atraumatic.  Eyes:     Conjunctiva/sclera: Conjunctivae normal.  Cardiovascular:     Rate and Rhythm: Normal rate and regular rhythm.     Heart sounds: No  murmur heard. Pulmonary:     Effort: Pulmonary effort is normal. No respiratory distress.     Breath sounds: Normal breath sounds.  Abdominal:     Palpations: Abdomen is soft.     Tenderness: There is no abdominal tenderness.  Musculoskeletal:        General: No swelling.     Cervical back: Neck supple.  Skin:    General: Skin is warm and dry.     Capillary Refill: Capillary refill takes less than 2 seconds.  Neurological:     Mental Status: She is alert.  Psychiatric:        Mood and Affect: Mood normal.     ED Results / Procedures / Treatments   Labs (all labs ordered are listed, but only abnormal results are displayed) Labs Reviewed  RESP PANEL BY RT-PCR (RSV, FLU A&B, COVID)  RVPGX2 - Abnormal; Notable for the following components:      Result Value   Influenza A by PCR POSITIVE (*)    All other components within normal limits  CBC WITH DIFFERENTIAL/PLATELET - Abnormal; Notable for the following components:   RBC 5.51 (*)    Hemoglobin 17.1 (*)    HCT 49.9 (*)    All other components within normal limits  COMPREHENSIVE METABOLIC PANEL - Abnormal; Notable for the following components:   Glucose, Bld 122 (*)    Creatinine, Ser 1.34 (*)    Total Bilirubin 1.3 (*)    GFR, Estimated 47 (*)    All other components within normal limits  URINALYSIS, ROUTINE W REFLEX MICROSCOPIC - Abnormal; Notable for the following components:   Hgb urine dipstick TRACE (*)    Ketones, ur 80 (*)    All other components within normal limits  URINALYSIS, MICROSCOPIC (REFLEX) - Abnormal; Notable for the following components:   Bacteria, UA MANY (*)    All other components within normal limits  LIPASE, BLOOD  HCG, SERUM, QUALITATIVE    EKG None  Radiology No results found.  Procedures Procedures    Medications Ordered in ED Medications  acetaminophen (TYLENOL) suppository 650 mg (has no administration in time range)  ketorolac (TORADOL) 15 MG/ML injection 15 mg (15 mg  Intravenous Not Given 02/01/24 1103)  prochlorperazine (COMPAZINE) injection 10 mg (10 mg Intravenous Given 02/01/24 1000)  lactated ringers bolus 1,000 mL (0 mLs Intravenous Stopped 02/01/24 1100)  lactated ringers bolus 1,000 mL (0 mLs Intravenous Stopped 02/01/24 1222)  ondansetron (ZOFRAN) injection 4 mg (4 mg Intravenous Given 02/01/24 1100)    ED Course/ Medical Decision Making/ A&P Clinical Course as of 02/01/24 1421  Sun Feb 01, 2024  1058 Upon reevaluation, patient states she is still feeling nauseous.  But she was sitting up drinking some water.  No further episodes of emesis.  She has not been able to give urine sample and her creatinine is elevated, will proceed with 1 more LR bolus as well as some Zofran for nausea. [AF]    Clinical Course User Index [AF] Arabella Merles, PA-C                                 Medical Decision Making Amount and/or Complexity of Data Reviewed Labs: ordered.  Risk OTC drugs. Prescription drug management.     Differential diagnosis includes but is not limited to COVID, flu, RSV, viral URI, strep pharyngitis, viral pharyngitis, allergic rhinitis, pneumonia, bronchitis Cholelithiasis, cholangitis, choledocholithiasis, peptic ulcer, gastritis, gastroenteritis, appendicitis, IBS, IBD, DKA, nephrolithiasis, UTI, pyelonephritis, pancreatitis, diverticulitis, mesenteric ischemia, abdominal aortic aneurysm, small bowel obstruction, volvulus, testicular torsion in males, ovarian torsion and pregnancy related concerns in females of childbearing age    ED Course:  Upon initial evaluation, patient appears uncomfortable on the stretcher, having some episodes of emesis here.  She was recently diagnosed with the flu.  Her flu testing here is also positive.  Negative for COVID and RSV.  CMP showed elevated creatinine 1.34, suspect this is prerenal in nature due to decreased oral intake.  No abdominal tenderness palpation, no elevation LFTs, lipase normal, no  leukocytosis, low concern for any acute abdominal pathology at this time. Suspect her nausea and vomiting are secondary to the flu 2L of LR given for dehydration Zofran and Compazine given for nausea. Tylenol suppository ordered for pain, patient declines I Ordered, and personally interpreted labs.  The pertinent results include:   CBC without leukocytosis CMP with elevation creatinine at 1.34.  No electrolyte abnormalities.  No elevation in LFTs. Lipase within normal limits Pregnancy negative Urinalysis without signs of UTI Influenza A positive, flu B, RSV, COVID-negative Upon re-evaluation, patient reports she is feeling much better.  She also looks visually better and is tolerating p.o. intake of water.  She asked  to to be discharged home.  Feel she is stable appropriate for discharge home at this time.  I discussed with her that I could prescribe compazine for her nausea since she already had tried Zofran at home.  She declines this.     Impression: Nausea and vomiting secondary to influenza A Dehydration  Disposition:  The patient was discharged home with instructions to keep well-hydrated with water.  May take her Zofran that was prescribed by PCP as needed for nausea or vomiting.  Follow-up with PCP if symptoms not improving within the next 3 days. Return precautions given.              Final Clinical Impression(s) / ED Diagnoses Final diagnoses:  Influenza A    Rx / DC Orders ED Discharge Orders     None         Arabella Merles, PA-C 02/01/24 1421    Cathren Laine, MD 02/02/24 1001

## 2024-02-01 NOTE — ED Notes (Addendum)
 PO challenge started per EDP verbal order

## 2024-02-11 DIAGNOSIS — I10 Essential (primary) hypertension: Secondary | ICD-10-CM | POA: Diagnosis not present

## 2024-02-11 DIAGNOSIS — Z1331 Encounter for screening for depression: Secondary | ICD-10-CM | POA: Diagnosis not present

## 2024-02-11 DIAGNOSIS — R82998 Other abnormal findings in urine: Secondary | ICD-10-CM | POA: Diagnosis not present

## 2024-02-11 DIAGNOSIS — Z0000 Encounter for general adult medical examination without abnormal findings: Secondary | ICD-10-CM | POA: Diagnosis not present

## 2024-02-11 DIAGNOSIS — Z1339 Encounter for screening examination for other mental health and behavioral disorders: Secondary | ICD-10-CM | POA: Diagnosis not present

## 2024-04-06 DIAGNOSIS — Z13820 Encounter for screening for osteoporosis: Secondary | ICD-10-CM | POA: Diagnosis not present

## 2024-06-21 DIAGNOSIS — N951 Menopausal and female climacteric states: Secondary | ICD-10-CM | POA: Diagnosis not present

## 2024-08-05 DIAGNOSIS — M545 Low back pain, unspecified: Secondary | ICD-10-CM | POA: Diagnosis not present

## 2024-08-11 DIAGNOSIS — N951 Menopausal and female climacteric states: Secondary | ICD-10-CM | POA: Diagnosis not present

## 2024-10-05 DIAGNOSIS — Z01419 Encounter for gynecological examination (general) (routine) without abnormal findings: Secondary | ICD-10-CM | POA: Diagnosis not present

## 2024-10-05 DIAGNOSIS — Z6825 Body mass index (BMI) 25.0-25.9, adult: Secondary | ICD-10-CM | POA: Diagnosis not present

## 2024-10-05 DIAGNOSIS — Z1231 Encounter for screening mammogram for malignant neoplasm of breast: Secondary | ICD-10-CM | POA: Diagnosis not present
